# Patient Record
Sex: Female | Born: 1950
Health system: Southern US, Community
[De-identification: ages and names within clinical notes are randomized; demographics above are authoritative.]

## PROBLEM LIST (undated history)

## (undated) DIAGNOSIS — N183 Chronic kidney disease, stage 3 unspecified: Secondary | ICD-10-CM

## (undated) DIAGNOSIS — I10 Essential (primary) hypertension: Secondary | ICD-10-CM

## (undated) DIAGNOSIS — E119 Type 2 diabetes mellitus without complications: Secondary | ICD-10-CM

## (undated) DIAGNOSIS — G459 Transient cerebral ischemic attack, unspecified: Secondary | ICD-10-CM

## (undated) DIAGNOSIS — E7800 Pure hypercholesterolemia, unspecified: Secondary | ICD-10-CM

## (undated) HISTORY — DX: Pure hypercholesterolemia, unspecified: E78.00

## (undated) HISTORY — PX: UTERINE FIBROID SURGERY: SHX826

## (undated) HISTORY — PX: EYE SURGERY: SHX253

## (undated) HISTORY — PX: CARPAL TUNNEL WITH CUBITAL TUNNEL: SHX5608

## (undated) HISTORY — DX: Transient cerebral ischemic attack, unspecified: G45.9

## (undated) HISTORY — DX: Essential (primary) hypertension: I10

---

## 1998-08-11 ENCOUNTER — Encounter: Admission: RE | Admit: 1998-08-11 | Discharge: 1998-11-09 | Payer: Self-pay | Admitting: Family Medicine

## 1998-09-13 ENCOUNTER — Ambulatory Visit (HOSPITAL_BASED_OUTPATIENT_CLINIC_OR_DEPARTMENT_OTHER): Admission: RE | Admit: 1998-09-13 | Discharge: 1998-09-13 | Payer: Self-pay | Admitting: Orthopedic Surgery

## 2002-02-12 ENCOUNTER — Other Ambulatory Visit: Admission: RE | Admit: 2002-02-12 | Discharge: 2002-02-12 | Payer: Self-pay | Admitting: Family Medicine

## 2002-02-19 ENCOUNTER — Ambulatory Visit (HOSPITAL_COMMUNITY): Admission: RE | Admit: 2002-02-19 | Discharge: 2002-02-19 | Payer: Self-pay | Admitting: Family Medicine

## 2002-02-19 ENCOUNTER — Encounter: Payer: Self-pay | Admitting: Family Medicine

## 2002-03-09 ENCOUNTER — Encounter: Payer: Self-pay | Admitting: Family Medicine

## 2002-03-09 ENCOUNTER — Ambulatory Visit (HOSPITAL_COMMUNITY): Admission: RE | Admit: 2002-03-09 | Discharge: 2002-03-09 | Payer: Self-pay | Admitting: Family Medicine

## 2002-04-14 ENCOUNTER — Encounter (INDEPENDENT_AMBULATORY_CARE_PROVIDER_SITE_OTHER): Payer: Self-pay

## 2002-04-14 ENCOUNTER — Encounter: Payer: Self-pay | Admitting: Family Medicine

## 2002-04-14 ENCOUNTER — Encounter: Admission: RE | Admit: 2002-04-14 | Discharge: 2002-04-14 | Payer: Self-pay | Admitting: Family Medicine

## 2005-07-12 ENCOUNTER — Encounter: Admission: RE | Admit: 2005-07-12 | Discharge: 2005-08-05 | Payer: Self-pay | Admitting: Family Medicine

## 2007-07-06 ENCOUNTER — Emergency Department (HOSPITAL_COMMUNITY): Admission: EM | Admit: 2007-07-06 | Discharge: 2007-07-06 | Payer: Self-pay | Admitting: Emergency Medicine

## 2008-09-02 ENCOUNTER — Other Ambulatory Visit: Admission: RE | Admit: 2008-09-02 | Discharge: 2008-09-02 | Payer: Self-pay | Admitting: Family Medicine

## 2009-09-25 ENCOUNTER — Emergency Department (HOSPITAL_COMMUNITY): Admission: EM | Admit: 2009-09-25 | Discharge: 2009-09-25 | Payer: Self-pay | Admitting: Emergency Medicine

## 2010-10-25 LAB — GLUCOSE, CAPILLARY
Glucose-Capillary: 249 mg/dL — ABNORMAL HIGH (ref 70–99)
Glucose-Capillary: 329 mg/dL — ABNORMAL HIGH (ref 70–99)
Glucose-Capillary: 413 mg/dL — ABNORMAL HIGH (ref 70–99)

## 2011-01-02 ENCOUNTER — Telehealth: Payer: Self-pay

## 2011-01-04 NOTE — Telephone Encounter (Signed)
Spoke with pt. She said she is anemic. She will schedule OV first. Darius Bump to schedule.

## 2011-01-05 HISTORY — PX: COLONOSCOPY W/ POLYPECTOMY: SHX1380

## 2011-01-08 ENCOUNTER — Encounter: Payer: Self-pay | Admitting: Gastroenterology

## 2011-01-08 ENCOUNTER — Ambulatory Visit (INDEPENDENT_AMBULATORY_CARE_PROVIDER_SITE_OTHER): Payer: BC Managed Care – PPO | Admitting: Gastroenterology

## 2011-01-08 VITALS — BP 159/79 | HR 90 | Temp 97.6°F | Ht 63.0 in | Wt 218.0 lb

## 2011-01-08 DIAGNOSIS — Z1212 Encounter for screening for malignant neoplasm of rectum: Secondary | ICD-10-CM | POA: Insufficient documentation

## 2011-01-08 DIAGNOSIS — Z1211 Encounter for screening for malignant neoplasm of colon: Secondary | ICD-10-CM | POA: Insufficient documentation

## 2011-01-08 NOTE — Assessment & Plan Note (Addendum)
60 year old female with need for original screening colonoscopy; she has never had a colonoscopy. No change in bowel habits, no melena or brbpr. No lack of appetite or wt changes. Very rare achy abdominal pain, located at level of umbilicus, every few months. Not relieved or exacerbated by anything. Not associated with any other symptoms. Question functional abdominal pain, scar tissue. No need for further work-up of this currently; we will proceed with screening colonoscopy.  Proceed with TCS with Dr. Oneida Alar in near future: the risks, benefits, and alternatives have been discussed with the patient in detail. The patient states understanding and desires to proceed. 1/2 dose Metformin evening prior, none day of  OBTAIN most recent labs from Dr. Criss Rosales. ?anemia? Lantus 10 units evening prior (usually takes 60) No Januvia morning of

## 2011-01-08 NOTE — Progress Notes (Signed)
Referring Provider: Dr. Criss Rosales Primary Care Physician:  Elyn Peers, MD Primary Gastroenterologist:  Dr. Oneida Alar  Chief Complaint  Patient presents with  . Colon Cancer Screening    HPI:  Kelly Blair is a 60 y.o. female here as a referral from Dr. Criss Rosales for screening colonoscopy. She has never had one prior. Reports a BM usually every day; the past few days she has had a few loose stools, but she has also over-indulged her diet this weekend. Denies melena or brbpr. No N/V. No lack of appetite or wt loss. No FH of colorectal ca. She does report very rare episodes of vague, intermittent, "achy" abdominal pain located at level of umbilicus. Usually every few months, rated as 3/4 out of 10. States not bothersome, just notices because she rarely has any discomfort. Not exacerbated or relieved by anything. As of note, does have midline laparotomy scar below umbilicus from prior surgery; states can't wear pants that hit at this level. Rare episodes of discomfort may be due to a benign process such as scar tissue. Pt does not feel this is a concern.   Some question of anemia per pt report/scheduling notes. No labs currently.   Past Medical History  Diagnosis Date  . Diabetes mellitus   . Hypertension   . Hypercholesterolemia     Past Surgical History  Procedure Date  . Fibroid removal from uterus   . Left carpal tunnel repair   . Pinched nerve left arm     Current Outpatient Prescriptions  Medication Sig Dispense Refill  . amLODipine (NORVASC) 5 MG tablet Take 5 mg by mouth daily.        . Amlodipine-Valsartan-HCTZ 10-320-25 MG TABS Take by mouth.        . insulin glargine (LANTUS) 100 UNIT/ML injection Inject 60 Units into the skin at bedtime.        . metFORMIN (GLUCOPHAGE) 500 MG tablet Take 500 mg by mouth 2 (two) times daily with a meal.        . simvastatin (ZOCOR) 40 MG tablet Take 40 mg by mouth at bedtime.        . sitaGLIPtan (JANUVIA) 100 MG tablet Take 100 mg by mouth  daily.          Allergies as of 01/08/2011  . (No Known Allergies)    Family History  Problem Relation Age of Onset  . Uterine cancer Mother     deceased, natural causes, dementia  . Diabetes Mother   . Heart disease Father     deceased secondary to septicemia    History   Social History  . Marital Status: Single    Spouse Name: N/A    Number of Children: N/A  . Years of Education: N/A   Occupational History  . laid-off     [employer]   Social History Main Topics  . Smoking status: Never Smoker   . Smokeless tobacco: Not on file  . Alcohol Use: No  . Drug Use: No  . Sexually Active: Not on file     Review of Systems: Gen: Denies any fever, chills, sweats, anorexia, fatigue, weakness, malaise, weight loss, and sleep disorder CV: Denies chest pain, angina, palpitations, syncope, orthopnea, PND, peripheral edema, and claudication. Resp: Denies dyspnea at rest, dyspnea with exercise, cough, sputum, wheezing, coughing up blood, and pleurisy. GI: Denies vomiting blood, jaundice, and fecal incontinence.   Denies dysphagia or odynophagia. GU : Denies urinary burning, blood in urine, urinary frequency, urinary hesitancy, nocturnal  urination, and urinary incontinence. MS: Denies joint pain, limitation of movement, and swelling, stiffness, low back pain, extremity pain. Denies muscle weakness, cramps, atrophy.  Derm: Denies rash, itching, dry skin, hives, moles, warts, or unhealing ulcers.  Psych: Denies depression, anxiety, memory loss, suicidal ideation, hallucinations, paranoia, and confusion. Heme: Denies bruising, bleeding, and enlarged lymph nodes.  Physical Exam: BP 159/79  Pulse 90  Temp(Src) 97.6 F (36.4 C) (Temporal)  Ht 5\' 3"  (1.6 m)  Wt 218 lb (98.884 kg)  BMI 38.62 kg/m2 General:   Alert,  Well-developed, well-nourished, pleasant and cooperative in NAD Head:  Normocephalic and atraumatic. Eyes:  Sclera clear, no icterus.   Conjunctiva pink. Ears:   Normal auditory acuity. Nose:  No deformity, discharge,  or lesions. Mouth:  No deformity or lesions, dentition normal. Neck:  Supple; no masses or thyromegaly. Lungs:  Clear throughout to auscultation.   No wheezes, crackles, or rhonchi. No acute distress. Heart:  Regular rate and rhythm; no murmurs, clicks, rubs,  or gallops. Abdomen:  Soft, nontender and nondistended. Obese. No masses, hepatosplenomegaly or hernias noted. Normal bowel sounds, without guarding, and without rebound. Midline laparotomy scar noted. Rectal:  Deferred until time of colonoscopy.   Msk:  Symmetrical without gross deformities. Normal posture. Extremities:  Without clubbing or edema. Neurologic:  Alert and  oriented x4;  grossly normal neurologically. Skin:  Intact without significant lesions or rashes. Cervical Nodes:  No significant cervical adenopathy. Psych:  Alert and cooperative. Normal mood and affect.

## 2011-01-08 NOTE — Progress Notes (Signed)
Cc to PCP 

## 2011-01-08 NOTE — Patient Instructions (Signed)
We have set you up for a screening colonoscopy. Please see the instructions below for adjustment of your diabetes medication:  1. Take 1/2 dose of metformin the evening before the procedure. Do not take any the morning of the procedure 2. Take Lantus 10 units the evening before the procedure.  3. No Januvia the morning of the procedure.  Continue to monitor your blood sugar for any drops; monitor any signs/symptoms of hypoglycemia such as weak, shaky, clammy, nausea, etc.

## 2011-01-22 ENCOUNTER — Encounter: Payer: BC Managed Care – PPO | Admitting: Gastroenterology

## 2011-01-22 ENCOUNTER — Other Ambulatory Visit: Payer: Self-pay | Admitting: Gastroenterology

## 2011-01-22 ENCOUNTER — Ambulatory Visit (HOSPITAL_COMMUNITY)
Admission: RE | Admit: 2011-01-22 | Discharge: 2011-01-22 | Disposition: A | Discharge status: Home / Self Care | Payer: BC Managed Care – PPO | Source: Ambulatory Visit | Attending: Gastroenterology | Admitting: Gastroenterology

## 2011-01-22 DIAGNOSIS — D126 Benign neoplasm of colon, unspecified: Secondary | ICD-10-CM

## 2011-01-22 DIAGNOSIS — I10 Essential (primary) hypertension: Secondary | ICD-10-CM | POA: Insufficient documentation

## 2011-01-22 DIAGNOSIS — Z1211 Encounter for screening for malignant neoplasm of colon: Secondary | ICD-10-CM | POA: Insufficient documentation

## 2011-01-22 DIAGNOSIS — E785 Hyperlipidemia, unspecified: Secondary | ICD-10-CM | POA: Insufficient documentation

## 2011-01-22 DIAGNOSIS — Z79899 Other long term (current) drug therapy: Secondary | ICD-10-CM | POA: Insufficient documentation

## 2011-01-22 DIAGNOSIS — K648 Other hemorrhoids: Secondary | ICD-10-CM

## 2011-01-22 DIAGNOSIS — Z83719 Family history of colon polyps, unspecified: Secondary | ICD-10-CM | POA: Insufficient documentation

## 2011-01-22 DIAGNOSIS — Z8371 Family history of colonic polyps: Secondary | ICD-10-CM | POA: Insufficient documentation

## 2011-01-22 DIAGNOSIS — Z794 Long term (current) use of insulin: Secondary | ICD-10-CM | POA: Insufficient documentation

## 2011-01-22 DIAGNOSIS — E119 Type 2 diabetes mellitus without complications: Secondary | ICD-10-CM | POA: Insufficient documentation

## 2011-01-22 LAB — GLUCOSE, CAPILLARY: Glucose-Capillary: 116 mg/dL — ABNORMAL HIGH (ref 70–99)

## 2011-01-22 NOTE — Progress Notes (Signed)
TCS 6/18-TC polyp

## 2011-01-25 NOTE — Progress Notes (Signed)
Cc path to Dr. Criss Rosales

## 2011-01-31 NOTE — Op Note (Signed)
  Kelly Blair, Kelly Blair              ACCOUNT NO.:  [id number]  MEDICAL RECORD NO.:  [id number]  LOCATION:  [facility]                          FACILITY:  [facility]  PHYSICIAN:  Barney Drain, M.D.     DATE OF BIRTH:  03-25-1951  DATE OF PROCEDURE:  01/22/2011 DATE OF DISCHARGE:                              OPERATIVE REPORT   REFERRING PHYSICIAN:  Lucianne Lei, MD  PROCEDURE:  Colonoscopy with cold forceps polypectomy.  INDICATION FOR EXAM:  [name] is a 60 year old female who presents for average-risk colon cancer screening.  Her mother had polyps at age greater than 75.  FINDINGS: 1. Slightly tortuous colon.  A 4 mm mid transverse colon polyp removed     via cold forceps.  Otherwise no masses, inflammatory changes or     AVMs seen.  No diverticulosis. 2. Small internal hemorrhoids.  Otherwise normal retroflexed view of     the rectum.  RECOMMENDATIONS: 1. She should follow a high-fiber diet.  She was given a handout on     high-fiber diet, polyps and hemorrhoids. 2. Screening colonoscopy in 10 years if she has a simple adenoma.  MEDICATIONS: 1. Demerol 75 mg IV. 2. Versed 4 mg IV.  PROCEDURE TECHNIQUE:  Physical exam was performed.  Informed consent was obtained from the patient after explaining the benefits, risks and alternatives to the procedure.  The patient was connected to the monitor and placed in left lateral position.  Continuous oxygen was provided by nasal cannula and IV medicine administered through an indwelling cannula.  After administration of sedation and rectal exam, the patient's rectum was intubated and the scope was advanced under direct visualization to the cecum.  The scope was removed slowly by carefully examining the color, texture, anatomy and integrity of the mucosa on the way out.  The patient was recovered in endoscopy and discharged home in satisfactory condition.  PATH: SIMPLE ADENOMA   Barney Drain, M.D.   CC: DR. Criss Rosales  SF/MEDQ  D:   01/22/2011  T:  01/23/2011  Job:  [id number]  Electronically Signed by Barney Drain M.D. on 01/31/2011 [id number] PM

## 2011-05-14 LAB — CULTURE, ROUTINE-ABSCESS

## 2013-07-06 DIAGNOSIS — G459 Transient cerebral ischemic attack, unspecified: Secondary | ICD-10-CM

## 2013-07-06 HISTORY — DX: Transient cerebral ischemic attack, unspecified: G45.9

## 2013-07-20 ENCOUNTER — Encounter (HOSPITAL_COMMUNITY): Payer: Self-pay | Admitting: Emergency Medicine

## 2013-07-20 ENCOUNTER — Emergency Department (HOSPITAL_COMMUNITY): Payer: BC Managed Care – PPO

## 2013-07-20 ENCOUNTER — Inpatient Hospital Stay (HOSPITAL_COMMUNITY)
Admission: EM | Admit: 2013-07-20 | Discharge: 2013-07-22 | DRG: 069 | Disposition: A | Discharge status: Home / Self Care | Payer: BC Managed Care – PPO | Attending: Internal Medicine | Admitting: Internal Medicine

## 2013-07-20 DIAGNOSIS — E1142 Type 2 diabetes mellitus with diabetic polyneuropathy: Secondary | ICD-10-CM | POA: Diagnosis present

## 2013-07-20 DIAGNOSIS — E1139 Type 2 diabetes mellitus with other diabetic ophthalmic complication: Secondary | ICD-10-CM | POA: Diagnosis present

## 2013-07-20 DIAGNOSIS — Z9119 Patient's noncompliance with other medical treatment and regimen: Secondary | ICD-10-CM

## 2013-07-20 DIAGNOSIS — Z794 Long term (current) use of insulin: Secondary | ICD-10-CM

## 2013-07-20 DIAGNOSIS — I160 Hypertensive urgency: Secondary | ICD-10-CM

## 2013-07-20 DIAGNOSIS — E083299 Diabetes mellitus due to underlying condition with mild nonproliferative diabetic retinopathy without macular edema, unspecified eye: Secondary | ICD-10-CM

## 2013-07-20 DIAGNOSIS — E11329 Type 2 diabetes mellitus with mild nonproliferative diabetic retinopathy without macular edema: Secondary | ICD-10-CM | POA: Diagnosis present

## 2013-07-20 DIAGNOSIS — G459 Transient cerebral ischemic attack, unspecified: Principal | ICD-10-CM | POA: Diagnosis present

## 2013-07-20 DIAGNOSIS — I129 Hypertensive chronic kidney disease with stage 1 through stage 4 chronic kidney disease, or unspecified chronic kidney disease: Secondary | ICD-10-CM | POA: Diagnosis present

## 2013-07-20 DIAGNOSIS — Z1211 Encounter for screening for malignant neoplasm of colon: Secondary | ICD-10-CM

## 2013-07-20 DIAGNOSIS — Z79899 Other long term (current) drug therapy: Secondary | ICD-10-CM

## 2013-07-20 DIAGNOSIS — G43909 Migraine, unspecified, not intractable, without status migrainosus: Secondary | ICD-10-CM | POA: Diagnosis present

## 2013-07-20 DIAGNOSIS — E1149 Type 2 diabetes mellitus with other diabetic neurological complication: Secondary | ICD-10-CM | POA: Diagnosis present

## 2013-07-20 DIAGNOSIS — N183 Chronic kidney disease, stage 3 unspecified: Secondary | ICD-10-CM | POA: Diagnosis present

## 2013-07-20 DIAGNOSIS — Z7982 Long term (current) use of aspirin: Secondary | ICD-10-CM

## 2013-07-20 DIAGNOSIS — I10 Essential (primary) hypertension: Secondary | ICD-10-CM

## 2013-07-20 DIAGNOSIS — Z91199 Patient's noncompliance with other medical treatment and regimen due to unspecified reason: Secondary | ICD-10-CM

## 2013-07-20 DIAGNOSIS — E785 Hyperlipidemia, unspecified: Secondary | ICD-10-CM

## 2013-07-20 DIAGNOSIS — I639 Cerebral infarction, unspecified: Secondary | ICD-10-CM

## 2013-07-20 LAB — URINALYSIS, ROUTINE W REFLEX MICROSCOPIC
Bilirubin Urine: NEGATIVE
Glucose, UA: >1000 mg/dL — AB
Leukocytes, UA: NEGATIVE
Nitrite: NEGATIVE
Protein, ur: >300 mg/dL — AB
Urobilinogen, UA: 0.2 mg/dL (ref 0.0–1.0)

## 2013-07-20 LAB — CBC
HCT: 39.8 % (ref 36.0–46.0)
Hemoglobin: 13.3 g/dL (ref 12.0–15.0)
MCH: 27.8 pg (ref 26.0–34.0)
MCHC: 33.4 g/dL (ref 30.0–36.0)
MCV: 83.1 fL (ref 78.0–100.0)
Platelets: 257 K/uL (ref 150–400)
RBC: 4.79 MIL/uL (ref 3.87–5.11)
RDW: 14 % (ref 11.5–15.5)
WBC: 10.4 K/uL (ref 4.0–10.5)

## 2013-07-20 LAB — GLUCOSE, CAPILLARY
Glucose-Capillary: 513 mg/dL — ABNORMAL HIGH (ref 70–99)
Glucose-Capillary: 343 mg/dL — ABNORMAL HIGH (ref 70–99)
Glucose-Capillary: 297 mg/dL — ABNORMAL HIGH (ref 70–99)

## 2013-07-20 LAB — COMPREHENSIVE METABOLIC PANEL
Albumin: 3.5 g/dL (ref 3.5–5.2)
BUN: 12 mg/dL (ref 6–23)
CO2: 25 mEq/L (ref 19–32)
Chloride: 96 mEq/L (ref 96–112)
Creatinine, Ser: 1.31 mg/dL — ABNORMAL HIGH (ref 0.50–1.10)
GFR calc non Af Amer: 43 mL/min — ABNORMAL LOW (ref >90)
Total Bilirubin: 0.2 mg/dL — ABNORMAL LOW (ref 0.3–1.2)
Total Protein: 7.8 g/dL (ref 6.0–8.3)

## 2013-07-20 MED ORDER — SODIUM CHLORIDE 0.9 % IV SOLN
INTRAVENOUS | Status: DC
  Administered 2013-07-21: via INTRAVENOUS

## 2013-07-20 MED ORDER — ACETAMINOPHEN 650 MG RE SUPP: 650.0000 mg | RECTAL | Status: DC | PRN

## 2013-07-20 MED ORDER — ACETAMINOPHEN 325 MG PO TABS: 650.0000 mg | ORAL_TABLET | ORAL | Status: DC | PRN

## 2013-07-20 MED ORDER — SENNOSIDES-DOCUSATE SODIUM 8.6-50 MG PO TABS
1.0000 | ORAL_TABLET | Freq: Every evening | ORAL | Status: DC | PRN
  Filled 2013-07-20: qty 1

## 2013-07-20 MED ORDER — LORAZEPAM 2 MG/ML IJ SOLN
1.0000 mg | INTRAMUSCULAR | Status: DC | PRN
  Administered 2013-07-20: 1 mg via INTRAVENOUS
  Filled 2013-07-20: qty 1

## 2013-07-20 MED ORDER — INSULIN ASPART 100 UNIT/ML ~~LOC~~ SOLN
10.0000 [IU] | Freq: Once | SUBCUTANEOUS | Status: AC
  Administered 2013-07-20: 10 [IU] via SUBCUTANEOUS
  Filled 2013-07-20: qty 1

## 2013-07-20 MED ORDER — MORPHINE SULFATE 4 MG/ML IJ SOLN
4.0000 mg | INTRAMUSCULAR | Status: DC | PRN
  Administered 2013-07-20: 4 mg via INTRAVENOUS
  Filled 2013-07-20: qty 1

## 2013-07-20 MED ORDER — ONDANSETRON HCL 4 MG/2ML IJ SOLN
4.0000 mg | Freq: Once | INTRAMUSCULAR | Status: AC
  Administered 2013-07-20: 4 mg via INTRAVENOUS
  Filled 2013-07-20: qty 2

## 2013-07-20 MED ORDER — ASPIRIN 325 MG PO TABS
325.0000 mg | ORAL_TABLET | Freq: Every day | ORAL | Status: DC
  Administered 2013-07-21: 325 mg via ORAL
  Filled 2013-07-20: qty 1

## 2013-07-20 MED ORDER — HYDROCODONE-ACETAMINOPHEN 5-325 MG PO TABS
1.0000 | ORAL_TABLET | ORAL | Status: DC | PRN
  Administered 2013-07-22 (×2): 2 via ORAL
  Filled 2013-07-20 (×2): qty 2

## 2013-07-20 MED ORDER — SODIUM CHLORIDE 0.9 % IV BOLUS (SEPSIS)
1000.0000 mL | Freq: Once | INTRAVENOUS | Status: AC
  Administered 2013-07-20: 1000 mL via INTRAVENOUS

## 2013-07-20 MED ORDER — METOCLOPRAMIDE HCL 5 MG/ML IJ SOLN
10.0000 mg | Freq: Once | INTRAMUSCULAR | Status: AC
  Administered 2013-07-20: 10 mg via INTRAVENOUS
  Filled 2013-07-20: qty 2

## 2013-07-20 MED ORDER — ASPIRIN 300 MG RE SUPP
300.0000 mg | Freq: Every day | RECTAL | Status: DC
  Filled 2013-07-20: qty 1

## 2013-07-20 MED ORDER — INSULIN GLARGINE 100 UNIT/ML ~~LOC~~ SOLN
60.0000 [IU] | Freq: Every day | SUBCUTANEOUS | Status: DC
  Administered 2013-07-21: 60 [IU] via SUBCUTANEOUS
  Filled 2013-07-20 (×3): qty 0.6

## 2013-07-20 MED ORDER — INSULIN ASPART 100 UNIT/ML ~~LOC~~ SOLN
0.0000 [IU] | SUBCUTANEOUS | Status: DC
  Administered 2013-07-21: 5 [IU] via SUBCUTANEOUS
  Administered 2013-07-21: 3 [IU] via SUBCUTANEOUS
  Administered 2013-07-21: 5 [IU] via SUBCUTANEOUS
  Administered 2013-07-21: 2 [IU] via SUBCUTANEOUS
  Administered 2013-07-21: 3 [IU] via SUBCUTANEOUS
  Administered 2013-07-22: 1 [IU] via SUBCUTANEOUS
  Administered 2013-07-22: 3 [IU] via SUBCUTANEOUS

## 2013-07-20 MED ORDER — HYDRALAZINE HCL 20 MG/ML IJ SOLN
10.0000 mg | INTRAMUSCULAR | Status: DC | PRN
  Administered 2013-07-22: 10 mg via INTRAVENOUS
  Filled 2013-07-20: qty 1

## 2013-07-20 NOTE — ED Notes (Signed)
Rechecked CBG 458. Pt very lethargic but opens eyes when spoken to and will respond appropriately. Will notify MD.

## 2013-07-20 NOTE — ED Notes (Signed)
Obtained CBG 513. Pt alert and oriented and resting in bed. Pt has no neuro deficits.

## 2013-07-20 NOTE — ED Notes (Signed)
Pt vomited after stroke swallow

## 2013-07-20 NOTE — ED Provider Notes (Addendum)
CSN: [id number]     Arrival date & time 07/20/13  1556 History   First MD Initiated Contact with Patient 07/20/13 1557     Chief Complaint  Patient presents with  . Hyperglycemia    HPI Pt's PCP is Dr. Lucianne Lei, Family Medicine Patient presents via ambulance with high blood sugar and headache. She is insulin-dependent diabetic. She uses Lantus insulin 60 units at night, and PO Januvia.. She is out of her needle tips for her Lantus pen. Has not had her insulin for about 5 days. Developed a "tension headache" yesterday. She states she gets these over a few weeks because her job is very stressful period in length and onset yesterday but slowly progressive headache. Bifrontal. Did not have good appetite today has not eaten much. Again, no insulin for several days. She got dizzy in and bumped her head against a cabinet this morning. This was with a dizzy spell where she almost fell but did not. She did not have syncope. She denies any pain other than a throbbing headache. No speech difficulty. No weakness to the extremities. No chest pain or palpitations. Her mouth feels dry. Not polyuric per her report.  Past Medical History  Diagnosis Date  . Diabetes mellitus   . Hypertension   . Hypercholesterolemia    Past Surgical History  Procedure Laterality Date  . Fibroid removal from uterus    . Left carpal tunnel repair    . Pinched nerve left arm     Family History  Problem Relation Age of Onset  . Uterine cancer Mother     deceased, natural causes, dementia  . Diabetes Mother   . Heart disease Father     deceased secondary to septicemia   History  Substance Use Topics  . Smoking status: Never Smoker   . Smokeless tobacco: Not on file  . Alcohol Use: No   OB History   Grav Para Term Preterm Abortions TAB SAB Ect Mult Living                 Review of Systems  Constitutional: Negative for fever, chills, diaphoresis, appetite change and fatigue.  HENT: Negative for mouth sores,  sore throat and trouble swallowing.   Eyes: Negative for visual disturbance.  Respiratory: Negative for cough, chest tightness, shortness of breath and wheezing.   Cardiovascular: Negative for chest pain.  Gastrointestinal: Positive for nausea and vomiting. Negative for abdominal pain, diarrhea and abdominal distention.       Vomited once on arrival here. Denies other episodes of diarrhea or vomiting.  Endocrine: Positive for polydipsia. Negative for polyphagia and polyuria.  Genitourinary: Negative for dysuria, frequency and hematuria.  Musculoskeletal: Negative for gait problem.  Skin: Negative for color change, pallor and rash.  Neurological: Positive for dizziness and headaches. Negative for syncope and light-headedness.  Hematological: Does not bruise/bleed easily.  Psychiatric/Behavioral: Negative for behavioral problems and confusion.    Allergies  Review of patient's allergies indicates no known allergies.  Home Medications   Current Outpatient Rx  Name  Route  Sig  Dispense  Refill  . Canagliflozin 300 MG TABS   Oral   Take 300 mg by mouth every morning.         . sitaGLIPtan (JANUVIA) 100 MG tablet   Oral   Take 100 mg by mouth daily.           Marland Kitchen amLODipine (NORVASC) 10 MG tablet   Oral   Take 1 tablet (10 mg total)  by mouth daily.   30 tablet   0   . aspirin 81 MG chewable tablet   Oral   Chew 1 tablet (81 mg total) by mouth daily.         Marland Kitchen atorvastatin (LIPITOR) 10 MG tablet   Oral   Take 1 tablet (10 mg total) by mouth daily at 6 PM.   30 tablet   0   . HYDROcodone-acetaminophen (NORCO/VICODIN) 5-325 MG per tablet   Oral   Take 1-2 tablets by mouth every 4 (four) hours as needed for moderate pain.   15 tablet   0   . insulin glargine (LANTUS) 100 UNIT/ML injection   Subcutaneous   Inject 60 Units into the skin at bedtime.           Marland Kitchen losartan (COZAAR) 100 MG tablet   Oral   Take 1 tablet (100 mg total) by mouth daily.   30 tablet   0     BP 170/66  Pulse 71  Temp(Src) 97.9 F (36.6 C) (Oral)  Resp 18  Ht 5\' 4"  (1.626 m)  Wt 195 lb 1.7 oz (88.5 kg)  BMI 33.47 kg/m2  SpO2 98% Physical Exam  Constitutional: She is oriented to person, place, and time. No distress.  Awake alert. She keeps her eyes closed.  HENT:  Head: Normocephalic. Head is without raccoon's eyes, without Battle's sign, without abrasion, without contusion and without laceration.  Dry mucous membranes  Eyes: Conjunctivae are normal. Pupils are equal, round, and reactive to light. No scleral icterus.  Neck: Normal range of motion. Neck supple. No thyromegaly present.  Cardiovascular: Normal rate and regular rhythm.  Exam reveals no gallop and no friction rub.   No murmur heard. Pulmonary/Chest: Effort normal and breath sounds normal. No respiratory distress. She has no wheezes. She has no rales.  Abdominal: Soft. Bowel sounds are normal. She exhibits no distension. There is no tenderness. There is no rebound.  Musculoskeletal: Normal range of motion.  Neurological: She is alert and oriented to person, place, and time.  Normal cranial nerves. . The patient prefers to keep her eyes closed. She is symmetrical for upper and lower face. She has left upper and lower extremity weakness that is rather subtle, 4/5.    Skin: Skin is warm and dry. No rash noted.  Psychiatric: She has a normal mood and affect. Her behavior is normal.    ED Course  Procedures (including critical care time) Labs Review Labs Reviewed  COMPREHENSIVE METABOLIC PANEL - Abnormal; Notable for the following:    Sodium 134 (*)    Glucose, Bld 513 (*)    Creatinine, Ser 1.31 (*)    Alkaline Phosphatase 173 (*)    Total Bilirubin 0.2 (*)    GFR calc non Af Amer 43 (*)    GFR calc Af Amer 50 (*)    All other components within normal limits  URINALYSIS, ROUTINE W REFLEX MICROSCOPIC - Abnormal; Notable for the following:    Glucose, UA >1000 (*)    Hgb urine dipstick SMALL (*)     Protein, ur >300 (*)    All other components within normal limits  GLUCOSE, CAPILLARY - Abnormal; Notable for the following:    Glucose-Capillary 513 (*)    All other components within normal limits  GLUCOSE, CAPILLARY - Abnormal; Notable for the following:    Glucose-Capillary 458 (*)    All other components within normal limits  GLUCOSE, CAPILLARY - Abnormal; Notable for the following:  Glucose-Capillary 343 (*)    All other components within normal limits  URINE MICROSCOPIC-ADD ON - Abnormal; Notable for the following:    Bacteria, UA FEW (*)    All other components within normal limits  GLUCOSE, CAPILLARY - Abnormal; Notable for the following:    Glucose-Capillary 297 (*)    All other components within normal limits  HEMOGLOBIN A1C - Abnormal; Notable for the following:    Hemoglobin A1C 11.5 (*)    Mean Plasma Glucose 283 (*)    All other components within normal limits  LIPID PANEL - Abnormal; Notable for the following:    Cholesterol 264 (*)    LDL Cholesterol 176 (*)    All other components within normal limits  GLUCOSE, CAPILLARY - Abnormal; Notable for the following:    Glucose-Capillary 207 (*)    All other components within normal limits  GLUCOSE, CAPILLARY - Abnormal; Notable for the following:    Glucose-Capillary 167 (*)    All other components within normal limits  GLUCOSE, CAPILLARY - Abnormal; Notable for the following:    Glucose-Capillary 221 (*)    All other components within normal limits  BASIC METABOLIC PANEL - Abnormal; Notable for the following:    Glucose, Bld 262 (*)    Creatinine, Ser 1.30 (*)    GFR calc non Af Amer 43 (*)    GFR calc Af Amer 50 (*)    All other components within normal limits  GLUCOSE, CAPILLARY - Abnormal; Notable for the following:    Glucose-Capillary 239 (*)    All other components within normal limits  GLUCOSE, CAPILLARY - Abnormal; Notable for the following:    Glucose-Capillary 280 (*)    All other components within  normal limits  GLUCOSE, CAPILLARY - Abnormal; Notable for the following:    Glucose-Capillary 150 (*)    All other components within normal limits  GLUCOSE, CAPILLARY - Abnormal; Notable for the following:    Glucose-Capillary 179 (*)    All other components within normal limits  GLUCOSE, CAPILLARY - Abnormal; Notable for the following:    Glucose-Capillary 164 (*)    All other components within normal limits  MRSA PCR SCREENING  CBC   Imaging Review Mr Virgel Paling Wo Contrast  07/21/2013   CLINICAL DATA:  Headache.  Abnormal CT.  Rule out stroke.  EXAM: MRI HEAD WITHOUT CONTRAST  MRA HEAD WITHOUT CONTRAST  TECHNIQUE: Multiplanar, multiecho pulse sequences of the brain and surrounding structures were obtained without intravenous contrast. Angiographic images of the head were obtained using MRA technique without contrast.  COMPARISON:  CT head 07/20/2013  FINDINGS: MRI HEAD FINDINGS  Negative for acute infarct. No lesion in the right temporal lobe as suggested by CT. The CT finding apparently was due to artifact as there was motion on the CT scan. Negative for acute or chronic infarct in the right lateral temporal lobe in the area of concern.  Generalized atrophy. Moderate chronic microvascular ischemic change in the white matter. Brainstem and cerebellum are normal.  Negative for acute infarct.  Negative for hemorrhage or mass.  Normal enhancement following contrast infusion.  MRA HEAD FINDINGS  Both vertebral arteries are patent to the basilar. Posterior inferior cerebellar artery patent bilaterally. The basilar is widely patent. Superior cerebellar and posterior cerebral arteries are patent bilaterally without significant stenosis. Patent posterior communicating artery bilaterally.  Internal carotid artery is widely patent. Mild atherosclerotic irregularity in the cavernous carotid bilaterally without significant stenosis. Anterior and middle cerebral arteries are  patent bilaterally without stenosis.   Negative for cerebral aneurysm.  IMPRESSION: Atrophy and chronic microvascular ischemic change. No acute infarct or mass lesion. No right temporal lobe abnormality as questioned on CT which apparently was motion related artifact.  Negative MRA head.   Electronically Signed   By: Franchot Gallo M.D.   On: 07/21/2013 12:10   Mr Jeri Cos [id number] Contrast  07/21/2013   CLINICAL DATA:  Headache.  Abnormal CT.  Rule out stroke.  EXAM: MRI HEAD WITHOUT CONTRAST  MRA HEAD WITHOUT CONTRAST  TECHNIQUE: Multiplanar, multiecho pulse sequences of the brain and surrounding structures were obtained without intravenous contrast. Angiographic images of the head were obtained using MRA technique without contrast.  COMPARISON:  CT head 07/20/2013  FINDINGS: MRI HEAD FINDINGS  Negative for acute infarct. No lesion in the right temporal lobe as suggested by CT. The CT finding apparently was due to artifact as there was motion on the CT scan. Negative for acute or chronic infarct in the right lateral temporal lobe in the area of concern.  Generalized atrophy. Moderate chronic microvascular ischemic change in the white matter. Brainstem and cerebellum are normal.  Negative for acute infarct.  Negative for hemorrhage or mass.  Normal enhancement following contrast infusion.  MRA HEAD FINDINGS  Both vertebral arteries are patent to the basilar. Posterior inferior cerebellar artery patent bilaterally. The basilar is widely patent. Superior cerebellar and posterior cerebral arteries are patent bilaterally without significant stenosis. Patent posterior communicating artery bilaterally.  Internal carotid artery is widely patent. Mild atherosclerotic irregularity in the cavernous carotid bilaterally without significant stenosis. Anterior and middle cerebral arteries are patent bilaterally without stenosis.  Negative for cerebral aneurysm.  IMPRESSION: Atrophy and chronic microvascular ischemic change. No acute infarct or mass lesion. No right  temporal lobe abnormality as questioned on CT which apparently was motion related artifact.  Negative MRA head.   Electronically Signed   By: Franchot Gallo M.D.   On: 07/21/2013 12:10    EKG Interpretation    Date/Time:  Monday July 20 2013 16:10:31 EST Ventricular Rate:  94 PR Interval:  152 QRS Duration: 75 QT Interval:  380 QTC Calculation: 475 R Axis:   31 Text Interpretation:  Sinus rhythm Borderline T wave abnormalities Borderline prolonged QT interval ED PHYSICIAN INTERPRETATION AVAILABLE IN [facility] Confirmed by TEST, RECORD ([id number]) on 07/22/2013 9:18:00 AM            MDM   1. CVA (cerebral infarction)   2. 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 Hypertensive urgency   4. TIA (transient ischemic attack)   5. Hypertension   6. Non-adherence to medical treatment   7. Other and unspecified hyperlipidemia   8. Morbid obesity   9. Screening for colorectal cancer     Plan will be hydration. Glycemic control. CAT scan of her head concerning headache and the fall.  20:00:  CT shows right parietal infarct. Her exam shows subtle left upper lip chin and weakness. She is given one dose of pain medication for her headache. This point I think her headache is from her impact from her fall her fall her weakness her from her right hemispheric CVA with left-sided weakness. Discussed case with Dr. Leonel Ramsay. He is here evaluating the patient. This appears to be a subacute infarct. She is out of the window for lytic therapy. I discussed the case with hospitalist patient be admitted under their care.  Lebron Quam, MD 07/20/13 1635  Lebron Quam, MD 07/23/13 [phone]

## 2013-07-20 NOTE — H&P (Signed)
PCP: Elyn Peers, MD    Chief Complaint:  Headache and she could not see  HPI: Kelly Blair is a 62 y.o. female   has a past medical history of Diabetes mellitus; Hypertension; and Hypercholesterolemia.   Presented with  Patient have had intermittent headaches for the past 3 weeks. Today she fell in the bathroom. Once she came to her wok she was confused and had trouble seeing she also had a headache. She has not been taking her insulin and she does not know what she supposed to take for her hypertension. Since patient arrived to ER she has been somewhat somnolent, prior to my evaluation she was also given Ativan 1 mg IV. At this point she is unable to cooperate with exam or give accurate history she does state that she could not see due to sun being in her eyes. Family at bedside. Neurology have seen patient. CT scan was done that showed Right mid  temporal lobe stroke.  Hospitalist called for admission. Of note patient have had severely elevated BP up to 215.    Review of Systems:   Pertinent positives include: headaches worsening confusion.  Constitutional:  No weight loss, night sweats, Fevers, chills, fatigue, weight loss  HEENT:  No headaches, Difficulty swallowing,Tooth/dental problems,Sore throat,  No sneezing, itching, ear ache, nasal congestion, post nasal drip,  Cardio-vascular:  No chest pain, Orthopnea, PND, anasarca, dizziness, palpitations.no Bilateral lower extremity swelling  GI:  No heartburn, indigestion, abdominal pain, nausea, vomiting, diarrhea, change in bowel habits, loss of appetite, melena, blood in stool, hematemesis Resp:  no shortness of breath at rest. No dyspnea on exertion, No excess mucus, no productive cough, No non-productive cough, No coughing up of blood.No change in color of mucus.No wheezing. Skin:  no rash or lesions. No jaundice GU:  no dysuria, change in color of urine, no urgency or frequency. No straining to urinate.  No flank pain.   Musculoskeletal:  No joint pain or no joint swelling. No decreased range of motion. No back pain.  Psych:  No change in mood or affect. No depression or anxiety. No memory loss.  Neuro: no localizing neurological complaints, no tingling, no weakness, no double vision, no gait abnormality, no slurred speech, no confusion  Otherwise ROS are negative except for above, 10 systems were reviewed  Past Medical History: Past Medical History  Diagnosis Date  . Diabetes mellitus   . Hypertension   . Hypercholesterolemia    Past Surgical History  Procedure Laterality Date  . Fibroid removal from uterus    . Left carpal tunnel repair    . Pinched nerve left arm       Medications: Prior to Admission medications   Medication Sig Start Date End Date Taking? Authorizing Provider  Canagliflozin 300 MG TABS Take 300 mg by mouth every morning.   Yes Historical Provider, MD  naproxen sodium (ANAPROX) 220 MG tablet Take 220 mg by mouth 2 (two) times daily as needed (pain).   Yes Historical Provider, MD  sitaGLIPtan (JANUVIA) 100 MG tablet Take 100 mg by mouth daily.     Yes Historical Provider, MD  insulin glargine (LANTUS) 100 UNIT/ML injection Inject 60 Units into the skin at bedtime.      Historical Provider, MD    Allergies:  No Known Allergies  Social History:  Ambulatory independently   Lives at   home   reports that she has never smoked. She does not have any smokeless tobacco history on file. She  reports that she does not drink alcohol or use illicit drugs.   Family History: family history includes Diabetes in her mother; Heart disease in her father; Uterine cancer in her mother.    Physical Exam: Patient Vitals for the past 24 hrs:  BP Temp Temp src Pulse Resp SpO2  07/20/13 1846 169/75 mmHg - - 86 16 99 %  07/20/13 1700 208/92 mmHg - - 93 16 99 %  07/20/13 1612 - 99.3 F (37.4 C) Oral - - -  07/20/13 1606 218/97 mmHg - Oral 93 17 100 %    1. General:  in No Acute  distress 2. Psychological: somnolent and confused  3. Head/ENT:   Moist  Mucous Membranes                          Head Non traumatic, neck supple                          Normal  Dentition 4. SKIN: normal Skin turgor,  Skin clean Dry and intact no rash 5. Heart: Regular rate and rhythm no Murmur, Rub or gallop 6. Lungs: Clear to auscultation bilaterally, no wheezes or crackles   7. Abdomen: Soft, non-tender, Non distended 8. Lower extremities: no clubbing, cyanosis, or edema 9. Neurologically she is moving ext spontaneously appears to have equal grips but unable to cooperate further. 10. MSK: Normal range of motion  body mass index is unknown because there is no weight on file.   Labs on Admission:   Recent Labs  07/20/13 1610  NA 134*  K 3.7  CL 96  CO2 25  GLUCOSE 513*  BUN 12  CREATININE 1.31*  CALCIUM 9.4    Recent Labs  07/20/13 1610  AST 15  ALT 12  ALKPHOS 173*  BILITOT 0.2*  PROT 7.8  ALBUMIN 3.5   No results found for this basename: LIPASE, AMYLASE,  in the last 72 hours  Recent Labs  07/20/13 1610  WBC 10.4  HGB 13.3  HCT 39.8  MCV 83.1  PLT 257   No results found for this basename: CKTOTAL, CKMB, CKMBINDEX, TROPONINI,  in the last 72 hours No results found for this basename: TSH, T4TOTAL, FREET3, T3FREE, THYROIDAB,  in the last 72 hours No results found for this basename: VITAMINB12, FOLATE, FERRITIN, TIBC, IRON, RETICCTPCT,  in the last 72 hours No results found for this basename: HGBA1C    The CrCl is unknown because both a height and weight (above a minimum accepted value) are required for this calculation. ABG No results found for this basename: phart, pco2, po2, hco3, tco2, acidbasedef, o2sat     No results found for this basename: DDIMER     Other results:  I have pearsonaly reviewed this: ECG REPORT  Rate: 94  Rhythm: NSR ST&T Change: no ischemic changes  UA no evidence of UTI   Cultures:    Component Value Date/Time    SDES ABSCESS LEFT BUTTOCKS 07/07/2007 0555   SPECREQUEST NONE 07/07/2007 0555   CULT  Value: ABUNDANT GROUP B STREP(S.AGALACTIAE)ISOLATED Note: TESTING AGAINST S. AGALACTIAE NOT ROUTINELY PERFORMED DUE TO PREDICTABILITY OF AMP/PEN/VAN SUSCEPTIBILITY. 07/07/2007 0555   REPTSTATUS 07/09/2007 FINAL 07/07/2007 0555       Radiological Exams on Admission: Ct Head Wo Contrast  07/20/2013   CLINICAL DATA:  Hypertension with headache  EXAM: CT HEAD WITHOUT CONTRAST  TECHNIQUE: Contiguous axial images were obtained from the base  of the skull through the vertex without intravenous contrast. Study was obtained within 24 hr of patient's arrival at the emergency department.  COMPARISON:  None.  FINDINGS: There is mild diffuse atrophy. There is no mass, hemorrhage, extra-axial fluid collection, or midline shift.  There is decreased attenuation in the mid right temporal lobe laterally compared to the left, a finding concerning for recent infarct. No other findings felt to be concerning for acute infarct. There is moderate small vessel disease in the centra semiovale bilaterally.  Bony calvarium appears intact.  The mastoid air cells are clear.  IMPRESSION: Area concerning for recent infarct in the mid right temporal lobe periphery. There is atrophy with periventricular small vessel disease. No hemorrhage or midline shift. Study otherwise unremarkable.   Electronically Signed   By: Lowella Grip M.D.   On: 07/20/2013 17:41    Chart has been reviewed  Assessment/Plan  62 yo F with hx of HTN and poorly controlled DM here with confusion was found to have right temporal lobe CVA  Present on Admission:  . CVA (cerebral infarction) -  - will admit based on  CVA protocol, await results of MRA/MRI, Carotid Doppler and Echo, obtain cardiac enzymes,  ECG,  Lipid panel, TSH. Order PT/OT evaluation. Will make sure patient is on antiplatelet agent.  Neurology consult.     . Hypertension/ hypertensive urgency avoid over  aggressive correction but given ongoing headache will give PRN hydralazine and observe in stepdown.  . Diabetes mellitus due to underlying condition with mild nonproliferative diabetic retinopathy without macular edema Restart insulin but hold po meds for now. SSI Confusion - patient have been recently medicated no evidence of intracranial hemorrhage on CT. Will be going for MRI now. Neurology is aware of the patient and is following  Prophylaxis: SCD      CODE STATUS: FULL CODE  Other plan as per orders.  I have spent a total of 55 min on this admission  Kalaya Infantino 07/20/2013, 8:45 PM

## 2013-07-20 NOTE — ED Notes (Signed)
CT was going to transport patient. Pt started vomiting. EDP reporting will order nausea medicine.

## 2013-07-20 NOTE — ED Notes (Signed)
Pt increasingly lethargic. Pt restless and defecated all in the bed. Pt is unable to answer questions appropriately, but will open eyes and grunt in response to questions. MD notified of this. Pt's family is at bedside.

## 2013-07-20 NOTE — Consult Note (Signed)
Neurology Consultation Reason for Consult: Concern for stroke Referring Physician: Alyson Locket  CC: Headache  History is obtained from: Brother, patient  HPI: Kelly Blair is a 62 y.o. female with a history of diabetes, hypertension who has not been acting right for at least the past few days. She was able to go to work today, however began having a severe headache with photophobia. On her way home, she became lost and when directed to local station, reported that she was unable to see it. It was clearly within her field of view according to the brother. She was therefore brought to the emergency room for further evaluation.  She is complaining of headache on arrival, and was given morphine shortly prior to my exam and history.   LKW: Several days ago, unclear tpa given?: no, outside of window    ROS: A 14 point ROS was performed and is negative except as noted in the HPI.  Past Medical History  Diagnosis Date  . Diabetes mellitus   . Hypertension   . Hypercholesterolemia     Family History: Mother-diabetes  Social History: Tob: Never smoker  Exam: Current vital signs: BP 169/75  Pulse 86  Temp(Src) 98.7 F (37.1 C) (Oral)  Resp 16  SpO2 99% Vital signs in last 24 hours: Temp:  [98.7 F (37.1 C)-99.3 F (37.4 C)] 98.7 F (37.1 C) (12/15 2118) Pulse Rate:  [86-93] 86 (12/15 1846) Resp:  [16-17] 16 (12/15 1846) BP: (169-218)/(75-97) 169/75 mmHg (12/15 1846) SpO2:  [99 %-100 %] 99 % (12/15 1846)  General: In bed, NAD CV: Regular rate and rhythm Mental Status: Patient is lethargic, oriented to person, place, month, year, and situation. She is able to answer specific questions, but does not participate much in the history No signs of aphasia. She does incorrectly identify touches to the left as being to the right. Cranial Nerves: II: Visual Fields are notable for left hemianopia. Pupils are equal, round, and reactive to light.  Discs are difficult to  visualize. III,IV, VI: EOMI without ptosis or diploplia.  V: Facial sensation is symmetric to temperature VII: Facial movement is symmetric.  VIII: hearing is intact to voice X: Uvula elevates symmetrically XI: Shoulder shrug is symmetric. XII: tongue is midline without atrophy or fasciculations.  Motor: Tone is normal. Bulk is normal. 5/5 strength was present in all four extremities.  Sensory: Sensation is symmetric to light touch and temperature in the arms and legs. Deep Tendon Reflexes: 2+ and symmetric in the biceps and patellae.  Cerebellar: FNF  intact bilaterally Gait: Not tested due to patient somnolent        I have reviewed labs in epic and the results pertinent to this consultation are: Mildly elevated creatinine  I have reviewed the images obtained: CT head-hypodensity in the right temporal lobe concerning for an inferior division MCA infarct.  Impression: 62 year old female with likely temporal lobe stroke in the setting of diabetes and hypertension. She'll need to be admitted for further evaluation  Recommendations: 1. HgbA1c, fasting lipid panel 2. MRI, MRA  of the brain without contrast 3. Frequent neuro checks 4. Echocardiogram 5. Carotid dopplers 6. Prophylactic therapy-Antiplatelet med: Aspirin - dose 325mg  7. Risk factor modification 8. Telemetry monitoring 9. PT consult, OT consult, Speech consult    Roland Rack, MD [facility] [phone]  If 7pm- 7am, please page neurology on call at [phone].

## 2013-07-20 NOTE — ED Notes (Signed)
Per EMS, pt is diabetic and ran out of insulin needles so has not taken insulin in several days. She also has hypertension and has not had her meds in a while. EMS obtained BP 234/125, HR 96, CBG >500. Pt complains of generalized weakness, headache but has no neuro deficits.

## 2013-07-20 NOTE — ED Notes (Signed)
Pt fell in tub today and says she hit the back of her head. She also states was driving and got confused and didn't know where she is. Pt is alert and oriented now.

## 2013-07-21 ENCOUNTER — Inpatient Hospital Stay (HOSPITAL_COMMUNITY): Payer: BC Managed Care – PPO

## 2013-07-21 DIAGNOSIS — I10 Essential (primary) hypertension: Secondary | ICD-10-CM

## 2013-07-21 DIAGNOSIS — E1339 Other specified diabetes mellitus with other diabetic ophthalmic complication: Secondary | ICD-10-CM

## 2013-07-21 DIAGNOSIS — E785 Hyperlipidemia, unspecified: Secondary | ICD-10-CM | POA: Diagnosis present

## 2013-07-21 DIAGNOSIS — Z9119 Patient's noncompliance with other medical treatment and regimen: Secondary | ICD-10-CM

## 2013-07-21 DIAGNOSIS — I6350 Cerebral infarction due to unspecified occlusion or stenosis of unspecified cerebral artery: Secondary | ICD-10-CM

## 2013-07-21 DIAGNOSIS — G459 Transient cerebral ischemic attack, unspecified: Principal | ICD-10-CM

## 2013-07-21 DIAGNOSIS — E11329 Type 2 diabetes mellitus with mild nonproliferative diabetic retinopathy without macular edema: Secondary | ICD-10-CM

## 2013-07-21 LAB — LIPID PANEL
Cholesterol: 264 mg/dL — ABNORMAL HIGH (ref 0–200)
HDL: 68 mg/dL (ref >39)
LDL Cholesterol: 176 mg/dL — ABNORMAL HIGH (ref 0–99)
Total CHOL/HDL Ratio: 3.9 RATIO
Triglycerides: 100 mg/dL (ref <150)
VLDL: 20 mg/dL (ref 0–40)

## 2013-07-21 LAB — BASIC METABOLIC PANEL
BUN: 11 mg/dL (ref 6–23)
Calcium: 8.6 mg/dL (ref 8.4–10.5)
Chloride: 102 mEq/L (ref 96–112)
Creatinine, Ser: 1.3 mg/dL — ABNORMAL HIGH (ref 0.50–1.10)
GFR calc Af Amer: 50 mL/min — ABNORMAL LOW (ref >90)
GFR calc non Af Amer: 43 mL/min — ABNORMAL LOW (ref >90)
Potassium: 4.1 mEq/L (ref 3.5–5.1)

## 2013-07-21 LAB — GLUCOSE, CAPILLARY
Glucose-Capillary: 221 mg/dL — ABNORMAL HIGH (ref 70–99)
Glucose-Capillary: 239 mg/dL — ABNORMAL HIGH (ref 70–99)

## 2013-07-21 LAB — MRSA PCR SCREENING: MRSA by PCR: NEGATIVE

## 2013-07-21 LAB — HEMOGLOBIN A1C: Hgb A1c MFr Bld: 11.5 % — ABNORMAL HIGH (ref <5.7)

## 2013-07-21 MED ORDER — AMLODIPINE BESYLATE 5 MG PO TABS
5.0000 mg | ORAL_TABLET | Freq: Every day | ORAL | Status: DC
  Administered 2013-07-21 – 2013-07-22 (×2): 5 mg via ORAL
  Filled 2013-07-21 (×3): qty 1

## 2013-07-21 MED ORDER — GADOBENATE DIMEGLUMINE 529 MG/ML IV SOLN
20.0000 mL | Freq: Once | INTRAVENOUS | Status: AC
  Administered 2013-07-21: 20 mL via INTRAVENOUS

## 2013-07-21 MED ORDER — ASPIRIN 81 MG PO CHEW
81.0000 mg | CHEWABLE_TABLET | Freq: Every day | ORAL | Status: DC
  Administered 2013-07-22: 81 mg via ORAL
  Filled 2013-07-21: qty 1

## 2013-07-21 MED ORDER — ATORVASTATIN CALCIUM 10 MG PO TABS
10.0000 mg | ORAL_TABLET | Freq: Every day | ORAL | Status: DC
  Filled 2013-07-21 (×2): qty 1

## 2013-07-21 MED ORDER — LOSARTAN POTASSIUM 50 MG PO TABS
50.0000 mg | ORAL_TABLET | Freq: Every day | ORAL | Status: DC
  Administered 2013-07-21 – 2013-07-22 (×2): 50 mg via ORAL
  Filled 2013-07-21 (×2): qty 1

## 2013-07-21 NOTE — Progress Notes (Addendum)
Occupational Therapy Evaluation Patient Details Name: Kelly Blair MRN: [id number] DOB: 1951/02/17 Today's Date: 07/21/2013 Time: [id number] OT Time Calculation (min): 33 min  OT Assessment / Plan / Recommendation History of present illness          Clinical Impression  62 y.o. female with a history of diabetes, hypertension who has not been acting right for at least the past few days. She was able to go to work today, however began having a severe headache with photophobia. On her way home, she became lost and when directed to local station, reported that she was unable to see it. It was clearly within her field of view according to the brother. She was therefore brought to the emergency room for further evaluation.      PTA, pt lived with cousin( who is disabled), and was independent with ADL and mobility. Pt worked in Therapist, art at [employer] and discussed how she does not like her job. Pt with apparent inconsistencies on eval. Presents with field cut in superior vision and L inferior quadrant at times. Reports she sees "dove like shapes" coming into her visual field from her R. States she walks "like a child with CP" at times. And that these symptoms have been going on for @ 2 weeks. During eval, pt with unsteady gait and extreme forward posture, requiring Max A to avoid falls. Apparent slow processing and difficulty with problem solving.  Abnormal postural control and reports of abnormal sensation on L side. At this time, pt is not safe to D/C home and will benefit from CIR to increase level of independence with ADL and moblity to facilitate safe D/C home. May benefit from psych consult.    OT Assessment  Patient needs continued OT Services    Follow Up Recommendations  CIR    Barriers to Discharge      Equipment Recommendations  3 in 1 bedside comode;Tub/shower bench    Recommendations for Other Services Rehab consult Psych consult  Frequency  Min 3X/week     Precautions / Restrictions Precautions Precautions: Fall Precaution Comments: per Dr. Leonie Man pt with L superior field cut Restrictions Weight Bearing Restrictions: No   Pertinent Vitals/Pain After mobilization BP 194/79. Sitting 169/78. HR 85. RR 18    ADL  Eating/Feeding: Independent Where Assessed - Eating/Feeding: Chair Grooming: Set up Where Assessed - Grooming: Supported sitting Upper Body Bathing: Set up Where Assessed - Upper Body Bathing: Unsupported sitting Lower Body Bathing: Moderate assistance Where Assessed - Lower Body Bathing: Supported sit to stand Upper Body Dressing: Minimal assistance Where Assessed - Upper Body Dressing: Supported sitting Lower Body Dressing: Moderate assistance Where Assessed - Lower Body Dressing: Supported sit to Lobbyist: Moderate assistance Toilet Transfer Method: Sit to stand Toilet Transfer Equipment: Other (comment) (bed - chair) Equipment Used: Gait belt Transfers/Ambulation Related to ADLs: min A transfer sit - stand.; Max at times with mobility. Staggering gait with forward osture. Poor awreness of self during ambulation; high risk for falls ADL Comments: decreased safety with ADL    OT Diagnosis: Generalized weakness;Cognitive deficits;Disturbance of vision;Altered mental status  OT Problem List: Decreased activity tolerance;Impaired balance (sitting and/or standing);Impaired vision/perception;Decreased cognition;Decreased safety awareness;Decreased knowledge of use of DME or AE;Cardiopulmonary status limiting activity;Impaired sensation OT Treatment Interventions: Self-care/ADL training;Therapeutic exercise;Neuromuscular education;Energy conservation;DME and/or AE instruction;Therapeutic activities;Cognitive remediation/compensation;Visual/perceptual remediation/compensation;Patient/family education;Balance training   OT Goals(Current goals can be found in the care plan section) Acute Rehab OT Goals Patient Stated Goal:  home OT Goal Formulation: With  patient Time For Goal Achievement: 08/04/13 Potential to Achieve Goals: Good  Visit Information  Last OT Received On: 07/21/13 Assistance Needed: +1 History of Present Illness: Pt admitted with headache and confusion. Suspect R temporal lobe lession.       Prior [place] expects to be discharged to:: Private residence Living Arrangements: Other relatives (cousin who isn't healthy) Available Help at Discharge: Family;Available PRN/intermittently Type of Home: House Home Access: Ramped entrance Home Layout: One level Home Equipment: None  Lives With: Family (cousin Allie Dimmer is disabled) Prior Function Level of Independence: Independent Comments: works at [employer]: No difficulties Dominant Hand: Right         Vision/Perception Vision - History Baseline Vision: Wears glasses all the time Patient Visual Report: Blurring of vision Vision - Assessment Eye Alignment: Within Functional Limits Vision Assessment: Vision tested Ocular Range of Motion: Within Functional Limits Alignment/Gaze Preference: Within Defined Limits Tracking/Visual Pursuits: Able to track stimulus in all quads without difficulty Saccades: Within functional limits Visual Fields: Impaired - to be further tested in functional context (inconsistent with field deficit. Pt reports not being ) Perception Perception: Within Functional Limits Praxis Praxis: Impaired Praxis Impairment Details: Motor planning (demonstrates defficulty with organizing tasks)   Cognition  Cognition Arousal/Alertness: Awake/alert Behavior During Therapy: WFL for tasks assessed/performed Overall Cognitive Status: Impaired/Different from baseline Area of Impairment: Memory;Problem solving;Attention;Safety/judgement;Awareness Current Attention Level: Sustained Memory: Decreased short-term memory Safety/Judgement: Decreased awareness of  safety;Decreased awareness of deficits Awareness: Emergent Problem Solving: Slow processing;Difficulty sequencing;Requires verbal cues;Requires tactile cues General Comments: Decreased safety during mobility; delayed problem solving    Extremity/Trunk Assessment Upper Extremity Assessment Upper Extremity Assessment: Overall WFL for tasks assessed;LUE deficits/detail LUE Sensation:  (Pt c/o L UE feeling funny) Lower Extremity Assessment Lower Extremity Assessment: LLE deficits/detail LLE Sensation:  (c/o LLE feeling funny) Cervical / Trunk Assessment Cervical / Trunk Assessment: Normal     Mobility Bed Mobility Bed Mobility: Supine to Sit;Sitting - Scoot to [employer] of Bed;Sit to Supine Supine to Sit: HOB elevated;5: Supervision Sitting - Scoot to Edge of Bed: 5: Supervision Sit to Supine: 5: Supervision Details for Bed Mobility Assistance: increased time Transfers Sit to Stand: 4: Min assist;With upper extremity assist;From bed Stand to Sit: 4: Min assist;With upper extremity assist;To bed Details for Transfer Assistance: tactile and directional cues, delayed processing, impaired sequencing     Exercise     Balance Balance Balance Assessed: Yes Static Sitting Balance Static Sitting - Balance Support: Bilateral upper extremity supported;Feet supported Static Sitting - Level of Assistance: 5: Stand by assistance (pt leaning posteriorly - unaware) Static Sitting - Comment/# of Minutes: 7 Static Standing Balance Static Standing - Balance Support: No upper extremity supported Static Standing - Level of Assistance: 3: Mod assist Static Standing - Comment/# of Minutes: ant bias   End of Session OT - End of Session Equipment Utilized During Treatment: Gait belt Activity Tolerance: Patient tolerated treatment well Patient left: in chair;with call bell/phone within reach Nurse Communication: Mobility status  GO     Morad Tal,HILLARY 07/21/2013, 2:50 PM [facility], OTR/L   [id number] 07/21/2013 [place], OTR/L  [phone] 07/21/2013

## 2013-07-21 NOTE — Progress Notes (Signed)
Utilization review completed.  

## 2013-07-21 NOTE — Progress Notes (Signed)
Stroke Team Progress Note  HISTORY Kelly Blair is a 62 y.o. female with a history of diabetes, hypertension who has not been acting right for at least the past few days. She was able to go to work today 07/20/2013, however began having a severe headache with photophobia. On her way home, she became lost and when directed to local station, reported that she was unable to see it. It was clearly within her field of view according to the brother. She was therefore brought to the emergency room for further evaluation. She is complaining of headache on arrival, and was given morphine shortly prior to my exam and history. Patient was not a TPA candidate secondary to delay in arrival. She was admitted to the SDU for further evaluation and treatment.  SUBJECTIVE Her brother is at the bedside.  Overall she feels her condition is unchanged.   OBJECTIVE Most recent Vital Signs: Filed Vitals:   07/20/13 2250 07/20/13 2335 07/21/13 0451 07/21/13 0731  BP: 182/80 164/77 141/63 166/65  Pulse: 109 87 80 76  Temp: 98.5 F (36.9 C)  98.9 F (37.2 C) 98.3 F (36.8 C)  TempSrc: Axillary  Oral Oral  Resp: 17 26 14 19   Height: 5\' 4"  (1.626 m)     Weight: 88.5 kg (195 lb 1.7 oz)     SpO2: 97% 100% 98% 96%   CBG (last 3)   Recent Labs  07/20/13 2252 07/21/13 0450 07/21/13 0730  GLUCAP 297* 207* 167*    IV Fluid Intake:   . sodium chloride 75 mL/hr at 07/21/13 0007    MEDICATIONS  . aspirin  300 mg Rectal Daily   Or  . aspirin  325 mg Oral Daily  . insulin aspart  0-9 Units Subcutaneous Q4H  . insulin glargine  60 Units Subcutaneous QHS   PRN:  acetaminophen, acetaminophen, hydrALAZINE, HYDROcodone-acetaminophen, senna-docusate  Diet:  NPO  Activity:  OOB with assistance DVT Prophylaxis:  SCDs ordered, not on  CLINICALLY SIGNIFICANT STUDIES Basic Metabolic Panel:  Recent Labs Lab 07/20/13 1610  NA 134*  K 3.7  CL 96  CO2 25  GLUCOSE 513*  BUN 12  CREATININE 1.31*  CALCIUM 9.4    Liver Function Tests:  Recent Labs Lab 07/20/13 1610  AST 15  ALT 12  ALKPHOS 173*  BILITOT 0.2*  PROT 7.8  ALBUMIN 3.5   CBC:  Recent Labs Lab 07/20/13 1610  WBC 10.4  HGB 13.3  HCT 39.8  MCV 83.1  PLT 257   Coagulation: No results found for this basename: LABPROT, INR,  in the last 168 hours Cardiac Enzymes: No results found for this basename: CKTOTAL, CKMB, CKMBINDEX, TROPONINI,  in the last 168 hours Urinalysis:  Recent Labs Lab 07/20/13 1843  COLORURINE YELLOW  LABSPEC 1.022  PHURINE 7.0  GLUCOSEU >1000*  HGBUR SMALL*  BILIRUBINUR NEGATIVE  KETONESUR NEGATIVE  PROTEINUR >300*  UROBILINOGEN 0.2  NITRITE NEGATIVE  LEUKOCYTESUR NEGATIVE   Lipid Panel    Component Value Date/Time   CHOL 264* 07/21/2013 0530   TRIG 100 07/21/2013 0530   HDL 68 07/21/2013 0530   CHOLHDL 3.9 07/21/2013 0530   VLDL 20 07/21/2013 0530   LDLCALC 176* 07/21/2013 0530   HgbA1C  No results found for this basename: HGBA1C    Urine Drug Screen:   No results found for this basename: labopia, cocainscrnur, labbenz, amphetmu, thcu, labbarb    Alcohol Level: No results found for this basename: ETH,  in the last 168 hours  CT of the brain  07/20/2013    Area concerning for recent infarct in the mid right temporal lobe periphery. There is atrophy with periventricular small vessel disease. No hemorrhage or midline shift. Study otherwise unremarkable.  MRI of the brain    MRA of the brain    2D Echocardiogram    Carotid Doppler    CXR    EKG  Normal sinus rhythm Nonspecific T wave abnormality Prolonged QT Abnormal ECG  Therapy Recommendations   Physical Exam   Pleasant middle aged african american lady not in distress.Awake alert. Afebrile. Head is nontraumatic. Neck is supple without bruit. Hearing is normal. Cardiac exam no murmur or gallop. Lungs are clear to auscultation. Distal pulses are well felt. Neurological Exam : Oriented x3 with normal speech and language.  Diminished recall 1/3. Diminished minimal naming 8 only. Slight distractibility. Comprehension is intact. Speech is fluent. No dysarthria. Extraocular movements are full range without nystagmus. Left superior quadrantic field defect on bedside confrontational testing. No facial weakness. Tongue is midline. Motor system exam revealed no upper or lower eczema to drift. Symmetric and equal strength in all 4 extremities. No focal weakness. Sensation is intact. Gait was not tested. ASSESSMENT Kelly Blair is a 62 y.o. female presenting with "not acting right", confused, headache with photophobia. CT unrevealing; MRI pending. Symptoms are not clearly stroke related. Suspect temporal ischemic stroke, other lesion or migraine related episode.  On no antithrombotics prior to admission. Now on aspirin for secondary stroke prevention. Patient with resultant left upper quandrantopsia, short term memory loss. Work up underway.  Hypertension Hyperlipidemia, LDL 176, on no statin PTA, now on no statin, goal LDL < 100 (< 70 for diabetics) Diabetes, HgbA1c pending, goal < 7.0   Hospital day # 1  TREATMENT/PLAN  Continue asprin for now for secondary stroke prevention until dx confirmed/refuted  Add contrast to MRI. F/u MRA, 2D, carotid doppler, HgbA1c  Consider adding statin for elevated LDL  Ok to transfer to the floor from stroke standpoint  Therapy evals  Burnetta Sabin, MSN, RN, ANVP-BC, ANP-BC, [facility] Pager: [phone] 07/21/2013 9:03 AM  I have personally obtained a history, examined the patient, evaluated imaging results, and formulated the assessment and plan of care. I agree with the above.  Antony Contras, MD

## 2013-07-21 NOTE — Progress Notes (Signed)
Rehab Admissions Coordinator Note:  Patient was screened by Cleatrice Burke for appropriateness for an Inpatient Acute Rehab Consult.  At this time, we are recommending Inpatient Rehab consult.  Cleatrice Burke 07/21/2013, 2:35 PM  I can be reached at [phone].

## 2013-07-21 NOTE — Evaluation (Signed)
Physical Therapy Evaluation Patient Details Name: Kelly Blair MRN: [id number] DOB: 06-Mar-1951 Today's Date: 07/21/2013 Time: [id number] PT Time Calculation (min): 26 min  PT Assessment / Plan / Recommendation History of Present Illness  Pt admitted with headache and confusion. Suspect R temporal lobe lession.  Clinical Impression  Pt with significant processing and sequencing deficits. Pt with L superior field cut as well. Pt currently requiring mod/maxA for safe transfers and ambulation at this time. Pt was indep and working PTA.  Pt an excellent candidate for CIR to achieve safe mod I function for safe transition home.    PT Assessment  Patient needs continued PT services    Follow Up Recommendations  CIR;Supervision/Assistance - 24 hour    Does the patient have the potential to tolerate intense rehabilitation      Barriers to Discharge Decreased caregiver support      Equipment Recommendations   (TBD)    Recommendations for Other Services Rehab consult   Frequency Min 4X/week    Precautions / Restrictions Precautions Precautions: Fall Precaution Comments: per Dr. Leonie Man pt with L superior field cut Restrictions Weight Bearing Restrictions: No   Pertinent Vitals/Pain Denies pain      Mobility  Bed Mobility Bed Mobility: Supine to Sit;Sitting - Scoot to Edge of Bed;Sit to Supine Supine to Sit: 2: Max assist;HOB flat Sitting - Scoot to Edge of Bed: 3: Mod assist Sit to Supine: 3: Mod assist Details for Bed Mobility Assistance:  max directional v/c's. Assist for trunk elevation and to bring hips to EOB. Pt reports "i forgot what you wanted me to do." Transfers Transfers: Sit to Stand;Stand to Sit Sit to Stand: 4: Min assist;With upper extremity assist;From bed Stand to Sit: 4: Min assist;With upper extremity assist;To bed Details for Transfer Assistance: tactile and directional cues, delayed processing, impaired sequencing Ambulation/Gait Ambulation/Gait  Assistance: 2: Max assist (2nd person helpful for lines) Ambulation Distance (Feet): 50 Feet Assistive device: Rolling walker;None Ambulation/Gait Assistance Details: pt extremely unsteady without RW however when given RW pt just as unsteady due to inability to use RW properly. Pt with significant sequencing impairment as well as comprehension of directional v/c's to manage walker. Pt with desire to vear left. max v/c's to stay in walker Gait Pattern: Step-through pattern;Decreased stride length (decreased step height, crossover) Gait velocity: slow General Gait Details: significant falls risk Stairs: No Modified Rankin (Stroke Patients Only) Pre-Morbid Rankin Score: No symptoms Modified Rankin: Moderately severe disability    Exercises     PT Diagnosis: Difficulty walking  PT Problem List: Decreased strength;Decreased activity tolerance;Decreased balance;Decreased mobility;Decreased cognition PT Treatment Interventions: DME instruction;Gait training;Stair training;Functional mobility training;Therapeutic activities;Therapeutic exercise;Balance training;Neuromuscular re-education;Cognitive remediation     PT Goals(Current goals can be found in the care plan section) Acute Rehab PT Goals Patient Stated Goal: home PT Goal Formulation: With patient Time For Goal Achievement: 07/28/13 Potential to Achieve Goals: Good  Visit Information  Last PT Received On: 07/21/13 Assistance Needed: +1 History of Present Illness: Pt admitted with headache and confusion. Suspect R temporal lobe lession.       Prior [place] expects to be discharged to:: Private residence Living Arrangements: Other relatives (cousin who isn't healthy) Available Help at Discharge: Family;Available PRN/intermittently Type of Home: House Home Access: Ramped entrance Home Layout: One level Home Equipment: None Prior Function Level of Independence: Independent Comments: works at  [employer]: No difficulties Dominant Hand: Right    Cognition  Cognition Arousal/Alertness: Awake/alert Behavior During  Therapy: WFL for tasks assessed/performed Overall Cognitive Status: Impaired/Different from baseline Area of Impairment: Memory;Problem solving Memory: Decreased short-term memory Problem Solving: Slow processing;Difficulty sequencing;Requires verbal cues;Requires tactile cues    Extremity/Trunk Assessment Upper Extremity Assessment Upper Extremity Assessment: Overall WFL for tasks assessed Lower Extremity Assessment Lower Extremity Assessment: Overall WFL for tasks assessed Cervical / Trunk Assessment Cervical / Trunk Assessment: Normal   Balance Balance Balance Assessed: Yes Static Sitting Balance Static Sitting - Balance Support: Bilateral upper extremity supported;Feet supported Static Sitting - Level of Assistance: 4: Min assist Static Sitting - Comment/# of Minutes: pt with increased trunk flex, tendency to lean L Static Standing Balance Static Standing - Balance Support: No upper extremity supported Static Standing - Level of Assistance: 3: Mod assist Static Standing - Comment/# of Minutes: lateral L sway, increased trunk flexion  End of Session PT - End of Session Equipment Utilized During Treatment: Gait belt Activity Tolerance: Patient limited by fatigue;Patient limited by lethargy Patient left: in bed;with family/visitor present;with call bell/phone within reach Nurse Communication: Mobility status  GP     Kingsley Callander 07/21/2013, 9:31 AM  Kittie Plater, PT, DPT Pager #: [phone] Office #: [phone]

## 2013-07-21 NOTE — Evaluation (Signed)
Speech Language Pathology Evaluation Patient Details Name: Kelly Blair MRN: [id number] DOB: 1950-09-05 Today's Date: 07/21/2013 Time: 1005-1015 SLP Time Calculation (min): 10 min  Problem List:  Patient Active Problem List   Diagnosis Date Noted  . CVA (cerebral infarction) 07/20/2013  . Hypertension 07/20/2013  . Diabetes mellitus due to underlying condition with mild nonproliferative diabetic retinopathy without macular edema 07/20/2013  . Screening for colorectal cancer 01/08/2011   Past Medical History:  Past Medical History  Diagnosis Date  . Diabetes mellitus   . Hypertension   . Hypercholesterolemia    Past Surgical History:  Past Surgical History  Procedure Laterality Date  . Fibroid removal from uterus    . Left carpal tunnel repair    . Pinched nerve left arm     HPI:  62 y.o. female has a past medical history of Diabetes mellitus; Hypertension; and Hypercholesterolemia.  Presented with intermittent headaches for the past 3 weeks. Today she fell in the bathroom. Once she came to her wok she was confused and had trouble seeing she also had a headache. She has not been taking her insulin and she does not know what she supposed to take for her hypertension. Since patient arrived to ER she has been somewhat somnolent, prior to my evaluation she was also given Ativan 1 mg IV. At this point she is unable to cooperate with exam or give accurate history she does state that she could not see due to sun being in her eyes.    CT scan was done that showed area concerning for recent infarct in the mid right temporal lobe.  MRI pending.    Assessment / Plan / Recommendation Clinical Impression  Pt. demonstrated mild-moderate cognitive deficits during assessment with brother present.  Intellectual and anticipatory awareness decreased with evidence of emergent awareness.  Deficits also detected with working and [employer] and higher level executive functions.  Pt. would  benefit from continued ST to increase independence.    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[employer])   Cognition  Overall Cognitive Status: Impaired/Different from baseline Arousal/Alertness:  (awake but drowsy) Orientation Level: Oriented to person;Oriented to place;Oriented to time;Disoriented to situation Attention: Sustained Sustained Attention: Appears intact Memory: Impaired Memory Impairment: Decreased short term memory;Decreased recall of new information (need to assess storage or retrieval) Decreased Short Term Memory: Verbal basic Awareness: Impaired Awareness Impairment: Anticipatory impairment;Emergent impairment;Intellectual impairment Problem Solving: Appears intact (verbal intact but likely deficits during performance) Executive Function: Decision Making;Self Monitoring;Self Correcting;Organizing Safety/Judgment:  (to be assessed further)    Comprehension  Auditory Comprehension Overall Auditory Comprehension: Appears within functional limits for tasks assessed Visual Recognition/Discrimination Discrimination: Not tested Reading Comprehension Reading Status:  (TBA)    Expression Expression Primary Mode of Expression: Verbal Verbal Expression Overall Verbal Expression: Appears within functional limits for tasks assessed Level of Generative/Spontaneous Verbalization: Conversation Naming:  (not exhibited) Pragmatics: No impairment Written Expression Dominant Hand: Right Written Expression:  (TBA)    Oral / Motor Oral Motor/Sensory Function Overall Oral Motor/Sensory Function: Appears within functional limits for tasks assessed Motor Speech  Overall Motor Speech: Appears within functional limits for tasks assessed Respiration: Within functional limits Phonation: Normal Resonance: Within functional limits Articulation: Within functional limitis Intelligibility: Intelligible Motor Planning: Witnin functional limits   GO     Kelly Blair.Ed [employer] [phone]  07/21/2013

## 2013-07-21 NOTE — Plan of Care (Signed)
Problem: Food- and Nutrition-Related Knowledge Deficit (NB-1.1) Goal: Nutrition education Formal process to instruct or train a patient/client in a skill or to impart knowledge to help patients/clients voluntarily manage or modify food choices and eating behavior to maintain or improve health. Outcome: Completed/Met Date Met:  07/21/13 Nutrition Education Note  RD consulted for nutrition education regarding a diet for diabetes, HTN, and hyperlipidemia.   Lipid Panel     Component Value Date/Time    CHOL 264* 07/21/2013 0530    TRIG 100 07/21/2013 0530    HDL 68 07/21/2013 0530    CHOLHDL 3.9 07/21/2013 0530    VLDL 20 07/21/2013 0530    LDLCALC 176* 07/21/2013 0530       Lab Results  Component Value Date    HGBA1C 11.5* 07/21/2013     Body mass index is 33.47 kg/(m^2). Pt meets criteria for obesity, class 1 based on current BMI.  RD provided "Stroke Nutrition Therapy" handout from the Academy of Nutrition and Dietetics. Reviewed patient's dietary recall. Provided examples on ways to decrease sodium and fat intake in diet. Discouraged intake of processed foods and use of salt shaker. Encouraged fresh fruits and vegetables as well as whole grain sources of carbohydrates to maximize fiber intake. Teach back method used.  Discussed importance of controlled and consistent carbohydrate intake throughout the day. Provided examples of ways to balance meals/snacks and encouraged intake of high-fiber, whole grain complex carbohydrates. Teach back method used.  Expect fair compliance.  Current diet order is CHO-modified, patient is consuming approximately 75% of meals at this time. Labs and medications reviewed. No further nutrition interventions warranted at this time. RD contact information provided. If additional nutrition issues arise, please re-consult RD. Will order OP nutrition counseling for diabetes.   Molli Barrows, RD, LDN, [place] Pager [phone] After Hours Pager [phone]

## 2013-07-21 NOTE — Progress Notes (Signed)
Patient transferred to 4N per MD orders. RN called report to receiving nurse, all questions answered.

## 2013-07-21 NOTE — Evaluation (Signed)
Clinical/Bedside Swallow Evaluation Patient Details  Name: FRANKA MCDANNEL MRN: [id number] Date of Birth: [birth year]  Today's Date: 07/21/2013 Time: 0950-1005 SLP Time Calculation (min): 15 min  Past Medical History:  Past Medical History  Diagnosis Date  . Diabetes mellitus   . Hypertension   . Hypercholesterolemia    Past Surgical History:  Past Surgical History  Procedure Laterality Date  . Fibroid removal from uterus    . Left carpal tunnel repair    . Pinched nerve left arm     HPI:  62 y.o. female has a past medical history of Diabetes mellitus; Hypertension; and Hypercholesterolemia.  Presented with intermittent headaches for the past 3 weeks. Today she fell in the bathroom. Once she came to her wok she was confused and had trouble seeing she also had a headache. She has not been taking her insulin and she does not know what she supposed to take for her hypertension. Since patient arrived to ER she has been somewhat somnolent, prior to my evaluation she was also given Ativan 1 mg IV. At this point she is unable to cooperate with exam or give accurate history she does state that she could not see due to sun being in her eyes.    CT scan was done that showed area concerning for recent infarct in the mid right temporal lobe.  MRI pending.    Assessment / Plan / Recommendation Clinical Impression  Pt. cooperative, awake and somewhat drowsy during swallow assessment.  No indications of oropharyngeal dysphagia during completion of 4 oz applesauce, water via straw and cracker.  Recommend regular solid texture and thin liquids, straws ok and pills with thin.  ST will see once more due to likely diagnosis of CVA and mild lethargy to ensure safe recommendations.    Aspiration Risk  Mild    Diet Recommendation Regular;Thin liquid   Liquid Administration via: Cup;Straw Medication Administration: Whole meds with liquid Supervision: Patient able to self feed;Intermittent supervision to  cue for compensatory strategies Compensations: Slow rate;Small sips/bites Postural Changes and/or Swallow Maneuvers: Seated upright 90 degrees    Other  Recommendations Oral Care Recommendations: Oral care BID   Follow Up Recommendations  None    Frequency and Duration min 1 x/week  1 week   Pertinent Vitals/Pain No pain         Swallow Study Prior Functional Status  Type of Home: House Available Help at Discharge: Family;Available PRN/intermittently       Oral/Motor/Sensory Function Overall Oral Motor/Sensory Function: Appears within functional limits for tasks assessed   Ice Chips Ice chips: Not tested   Thin Liquid Thin Liquid: Within functional limits Presentation: Cup;Straw    Nectar Thick Nectar Thick Liquid: Not tested   Honey Thick Honey Thick Liquid: Not tested   Puree Puree: Within functional limits   Solid   GO    Solid: Within functional limits       Houston Siren M.Ed [employer] [phone]  07/21/2013

## 2013-07-21 NOTE — Progress Notes (Addendum)
[facility] Progress Note [place] TEAM 1 - Stepdown/ICU TEAM   Kelly Blair [id number] DOB: 02/13/1951 DOA: 07/20/2013 PCP: Elyn Peers, MD  Brief narrative: Is a 62 year old female with a history of diabetes hypertension and hyper lipidemia who presents with a headache and trouble with vision. History is obtained today from her brothers who states that the patient has not been taking any of her medications for months. She has been having headaches recently and the morning prior to admission had trouble with ambulating but nonetheless went to work. At work she continued to feel bad and therefore decided to go home. When driving home she had trouble with vision and was unable to see a stop sign that was within her visual field. She was admitted to the hospital with a presumptive diagnosis of a CVA. The patient states that she's been under a great deal of stress recently which is why she has not been taking her medications. She does have her insulin at home but does not have any syringes.  Assessment/Plan: Principal Problem:   TIA (transient ischemic attack) - cont baby ASA- discussed strict control of her major medical problems to prevent a CVA  Active Problems:   Hypertension - pt thinks she was on Cozaar samples - cont Cozaar and add Norvasc  CKD 3 - baseline uncertain but pt was told she has kidney problems    Diabetes mellitus due to underlying condition with mild nonproliferative diabetic retinopathy without macular edema - Uncontrolled - HbA1c- 11.5 - cont Lantus and follow sugars    Other and unspecified hyperlipidemia - start Crestor    Morbid obesity - dietician consult for instruction     Non-adherence to medical treatment - will need HHRN to ensure she taking meds- brothers will follow closely as well     Code Status: full code Family Communication: borthers Disposition Plan: likely home-   Consultants: Neuro  Antibiotics: none  DVT  prophylaxis: SCDs  HPI/Subjective: Pt alert - not fully disclosing history in regards to medication non-compliance- has no physical complaints but complains about stress in her life  Objective: Blood pressure 185/81, pulse 76, temperature 98.5 F (36.9 C), temperature source Oral, resp. rate 18, height 5\' 4"  (1.626 m), weight 88.5 kg (195 lb 1.7 oz), SpO2 100.00%.  Intake/Output Summary (Last 24 hours) at 07/21/13 1859 Last data filed at 07/21/13 1700  Gross per 24 hour  Intake   1140 ml  Output    450 ml  Net    690 ml     Exam: General: No acute respiratory distress Lungs: Clear to auscultation bilaterally without wheezes or crackles Cardiovascular: Regular rate and rhythm without murmur gallop or rub normal S1 and S2 Abdomen: Nontender, nondistended, soft, bowel sounds positive, no rebound, no ascites, no appreciable mass Extremities: No significant cyanosis, clubbing, or edema bilateral lower extremities  Data Reviewed: Basic Metabolic Panel:  Recent Labs Lab 07/20/13 1610 07/21/13 1330  NA 134* 135  K 3.7 4.1  CL 96 102  CO2 25 24  GLUCOSE 513* 262*  BUN 12 11  CREATININE 1.31* 1.30*  CALCIUM 9.4 8.6   Liver Function Tests:  Recent Labs Lab 07/20/13 1610  AST 15  ALT 12  ALKPHOS 173*  BILITOT 0.2*  PROT 7.8  ALBUMIN 3.5   No results found for this basename: LIPASE, AMYLASE,  in the last 168 hours No results found for this basename: AMMONIA,  in the last 168 hours CBC:  Recent Labs Lab 07/20/13 1610  WBC 10.4  HGB 13.3  HCT 39.8  MCV 83.1  PLT 257   Cardiac Enzymes: No results found for this basename: CKTOTAL, CKMB, CKMBINDEX, TROPONINI,  in the last 168 hours BNP (last 3 results) No results found for this basename: PROBNP,  in the last 8760 hours CBG:  Recent Labs Lab 07/20/13 2252 07/21/13 0450 07/21/13 0730 07/21/13 1216 07/21/13 1534  GLUCAP 297* 207* 167* 221* 239*    Recent Results (from the past 240 hour(s))  MRSA PCR  SCREENING     Status: None   Collection Time    07/21/13  6:22 AM      Result Value Range Status   MRSA by PCR NEGATIVE  NEGATIVE Final   Comment:            The GeneXpert MRSA Assay (FDA     approved for NASAL specimens     only), is one component of a     comprehensive MRSA colonization     surveillance program. It is not     intended to diagnose MRSA     infection nor to guide or     monitor treatment for     MRSA infections.     Studies:  Recent x-ray studies have been reviewed in detail by the Attending Physician  Scheduled Meds:  Scheduled Meds: . amLODipine  5 mg Oral Daily  . aspirin  81 mg Oral Daily  . atorvastatin  10 mg Oral q1800  . insulin aspart  0-9 Units Subcutaneous Q4H  . insulin glargine  60 Units Subcutaneous QHS   Continuous Infusions:   Time spent on care of this patient: 53 min   Debbe Odea, MD  [facility] Office  [phone] Pager - Text Page per Shea Evans as per below:  On-Call/Text Page:      [email or web address]      password [email or web address]  If 7PM-7AM, please contact night-coverage [email or web address] Password [email or web address] 07/21/2013, 6:59 PM   LOS: 1 day

## 2013-07-22 DIAGNOSIS — R5381 Other malaise: Secondary | ICD-10-CM

## 2013-07-22 LAB — GLUCOSE, CAPILLARY: Glucose-Capillary: 164 mg/dL — ABNORMAL HIGH (ref 70–99)

## 2013-07-22 MED ORDER — LOSARTAN POTASSIUM 50 MG PO TABS: 100.0000 mg | ORAL_TABLET | Freq: Every day | ORAL | Status: DC

## 2013-07-22 MED ORDER — AMLODIPINE BESYLATE 10 MG PO TABS: 10.0000 mg | ORAL_TABLET | Freq: Every day | ORAL | Status: AC

## 2013-07-22 MED ORDER — ASPIRIN 81 MG PO CHEW: 81.0000 mg | CHEWABLE_TABLET | Freq: Every day | ORAL | Status: DC

## 2013-07-22 MED ORDER — AMLODIPINE BESYLATE 10 MG PO TABS: 10.0000 mg | ORAL_TABLET | Freq: Every day | ORAL | Status: DC

## 2013-07-22 MED ORDER — ATORVASTATIN CALCIUM 10 MG PO TABS: 10.0000 mg | ORAL_TABLET | Freq: Every day | ORAL | Status: DC

## 2013-07-22 MED ORDER — HYDROCODONE-ACETAMINOPHEN 5-325 MG PO TABS: 1.0000 | ORAL_TABLET | ORAL | Status: DC | PRN

## 2013-07-22 MED ORDER — LOSARTAN POTASSIUM 100 MG PO TABS: 100.0000 mg | ORAL_TABLET | Freq: Every day | ORAL | Status: DC

## 2013-07-22 NOTE — Progress Notes (Signed)
Talked to patient about discharge planning, patient is returning home at discharge and needs outpatient physical therapy; Patient is agreeable to outpatient PT at the [facility]; clinical information and orders faxed; Patient is to also follow up with Outpatient Diabetes Management; B Lamonte Sakai, MHA

## 2013-07-22 NOTE — Progress Notes (Addendum)
Stroke Team Progress Note  HISTORY Kelly Blair is a 62 y.o. female with a history of diabetes, hypertension who has not been acting right for at least the past few days. She was able to go to work today 07/20/2013, however began having a severe headache with photophobia. On her way home, she became lost and when directed to local station, reported that she was unable to see it. It was clearly within her field of view according to the brother. She was therefore brought to the emergency room for further evaluation. She is complaining of headache on arrival, and was given morphine shortly prior to my exam and history. Patient was not a TPA candidate secondary to delay in arrival. She was admitted to the SDU for further evaluation and treatment.  SUBJECTIVE No family at bedside. Pt reports her headache is back this am.  OBJECTIVE Most recent Vital Signs: Filed Vitals:   07/21/13 1700 07/21/13 2131 07/22/13 0300 07/22/13 0612  BP: 185/81 172/65 183/84 174/71  Pulse: 76 74 70 68  Temp: 98.5 F (36.9 C) 98.2 F (36.8 C) 98.4 F (36.9 C) 98.4 F (36.9 C)  TempSrc: Oral Oral Oral Oral  Resp: 18 18 20 18   Height:      Weight:      SpO2: 100% 95% 95% 99%   CBG (last 3)   Recent Labs  07/21/13 1534 07/21/13 2131 07/22/13 0649  GLUCAP 239* 280* 150*    IV Fluid Intake:      MEDICATIONS  . [START ON 07/23/2013] amLODipine  10 mg Oral Daily  . aspirin  81 mg Oral Daily  . atorvastatin  10 mg Oral q1800  . insulin aspart  0-9 Units Subcutaneous Q4H  . insulin glargine  60 Units Subcutaneous QHS  . losartan  50 mg Oral Daily   PRN:  acetaminophen, acetaminophen, hydrALAZINE, HYDROcodone-acetaminophen, senna-docusate  Diet:  Carb Control  Activity:  OOB with assistance DVT Prophylaxis:  SCDs ordered, not on  CLINICALLY SIGNIFICANT STUDIES Basic Metabolic Panel:   Recent Labs Lab 07/20/13 1610 07/21/13 1330  NA 134* 135  K 3.7 4.1  CL 96 102  CO2 25 24  GLUCOSE 513*  262*  BUN 12 11  CREATININE 1.31* 1.30*  CALCIUM 9.4 8.6   Liver Function Tests:   Recent Labs Lab 07/20/13 1610  AST 15  ALT 12  ALKPHOS 173*  BILITOT 0.2*  PROT 7.8  ALBUMIN 3.5   CBC:   Recent Labs Lab 07/20/13 1610  WBC 10.4  HGB 13.3  HCT 39.8  MCV 83.1  PLT 257   Coagulation: No results found for this basename: LABPROT, INR,  in the last 168 hours Cardiac Enzymes: No results found for this basename: CKTOTAL, CKMB, CKMBINDEX, TROPONINI,  in the last 168 hours Urinalysis:   Recent Labs Lab 07/20/13 1843  COLORURINE YELLOW  LABSPEC 1.022  PHURINE 7.0  GLUCOSEU >1000*  HGBUR SMALL*  BILIRUBINUR NEGATIVE  KETONESUR NEGATIVE  PROTEINUR >300*  UROBILINOGEN 0.2  NITRITE NEGATIVE  LEUKOCYTESUR NEGATIVE   Lipid Panel    Component Value Date/Time   CHOL 264* 07/21/2013 0530   TRIG 100 07/21/2013 0530   HDL 68 07/21/2013 0530   CHOLHDL 3.9 07/21/2013 0530   VLDL 20 07/21/2013 0530   LDLCALC 176* 07/21/2013 0530   HgbA1C  Lab Results  Component Value Date   HGBA1C 11.5* 07/21/2013    Urine Drug Screen:   No results found for this basename: labopia,  cocainscrnur,  labbenz,  amphetmu,  thcu,  labbarb    Alcohol Level: No results found for this basename: ETH,  in the last 168 hours  CT of the brain  07/20/2013    Area concerning for recent infarct in the mid right temporal lobe periphery. There is atrophy with periventricular small vessel disease. No hemorrhage or midline shift. Study otherwise unremarkable.  MRI of the brain    MRA of the brain    2D Echocardiogram    Carotid Doppler    CXR    EKG  Normal sinus rhythm Nonspecific T wave abnormality Prolonged QT Abnormal ECG  Therapy Recommendations   Physical Exam   Pleasant middle aged african american lady not in distress.Awake alert. Afebrile. Head is nontraumatic. Neck is supple without bruit. Hearing is normal. Cardiac exam no murmur or gallop. Lungs are clear to auscultation.  Distal pulses are well felt. Neurological Exam : Oriented x3 with normal speech and language. Diminished recall 1/3. Diminished minimal naming 8 only. Slight distractibility. Comprehension is intact. Speech is fluent. No dysarthria. Extraocular movements are full range without nystagmus. Left superior quadrantic field defect on bedside confrontational testing. No facial weakness. Tongue is midline. Motor system exam revealed no upper or lower eczema to drift. Symmetric and equal strength in all 4 extremities. No focal weakness. Sensation is intact. Gait was not tested.  ASSESSMENT [name] is a 63 y.o. female presenting with "not acting right", confused, headache with photophobia. CT unrevealing; MRI negative for acute stroke. Do not feel pt has had a TIA. Symptoms are not clearly stroke related. DX:  migraine related episode.  On no antithrombotics prior to admission. Now on aspirin for secondary stroke prevention. Patient with resultant left upper quandrantopsia, short term memory loss. Work up completed.  Hypertension Hyperlipidemia, LDL 176, on no statin PTA, now on no statin, goal LDL < 100 (< 70 for diabetics) Diabetes, HgbA1c11.5, goal < 7.0   Hospital day # 2  TREATMENT/PLAN Continue asprin . No further stroke workup indicated. Ongoing risk factor control by Primary Care Physician Stroke Service will sign off. Please call should any needs arise.  No stroke follow up indicated. Please refer to neurology if needed in the future for headache management.  Burnetta Sabin, MSN, RN, ANVP-BC, ANP-BC, [facility] Pager: [phone] 07/22/2013 11:27 AM  I have personally obtained a history, examined the patient, evaluated imaging results, and formulated the assessment and plan of care. I agree with the above. Antony Contras, MD

## 2013-07-22 NOTE — Progress Notes (Signed)
Patient not in need of an inpt rehab admission at this time. Call me with any questions. [phone]

## 2013-07-22 NOTE — Progress Notes (Addendum)
[facility] Progress Note    Kelly Blair [id number] DOB: 12/19/1950 DOA: 07/20/2013 PCP: Elyn Peers, MD  Brief narrative: Is a 62 year old female with a history of diabetes hypertension and hyper lipidemia who presents with a headache and trouble with vision. History is obtained today from her brothers who states that the patient has not been taking any of her medications for months. She has been having headaches recently and the morning prior to admission had trouble with ambulating but nonetheless went to work. At work she continued to feel bad and therefore decided to go home. When driving home she had trouble with vision and was unable to see a stop sign that was within her visual field. She was admitted to the hospital with a presumptive diagnosis of a CVA. The patient states that she's been under a great deal of stress recently which is why she has not been taking her medications. She does have her insulin at home but does not have any syringes.  Assessment/Plan: Principal Problem:   TIA (transient ischemic attack)- MRI negative for CVA but has atrophy and chronic changes - cont baby ASA- discussed strict control of her major medical problems to prevent a CVA    Hypertension - pt thinks she was on Cozaar samples - cont Cozaar and increase Norvasc  CKD 3 - baseline uncertain but pt was told she has kidney problems Appears stable    Diabetes mellitus due to underlying condition with mild nonproliferative diabetic retinopathy without macular edema - Uncontrolled - HbA1c- 11.5 - cont Lantus and follow sugars    Other and unspecified hyperlipidemia - start Crestor    Morbid obesity - dietician consult for instruction     Non-adherence to medical treatment     Code Status: full code Family Communication: borthers Disposition Plan: CIR eval?   Consultants: Neuro  Antibiotics: none  DVT prophylaxis: SCDs  HPI/Subjective: Doing well, no new  c/o  Objective: Blood pressure 174/71, pulse 68, temperature 98.4 F (36.9 C), temperature source Oral, resp. rate 18, height 5\' 4"  (1.626 m), weight 88.5 kg (195 lb 1.7 oz), SpO2 99.00%.  Intake/Output Summary (Last 24 hours) at 07/22/13 0853 Last data filed at 07/22/13 0800  Gross per 24 hour  Intake    660 ml  Output    450 ml  Net    210 ml     Exam: General: No acute respiratory distress Lungs: Clear to auscultation bilaterally without wheezes or crackles Cardiovascular: Regular rate and rhythm without murmur gallop or rub normal S1 and S2 Abdomen: Nontender, nondistended, soft, bowel sounds positive, no rebound, no ascites, no appreciable mass Extremities: No significant cyanosis, clubbing, or edema bilateral lower extremities Neuro- good strength in all 4 ext  Data Reviewed: Basic Metabolic Panel:  Recent Labs Lab 07/20/13 1610 07/21/13 1330  NA 134* 135  K 3.7 4.1  CL 96 102  CO2 25 24  GLUCOSE 513* 262*  BUN 12 11  CREATININE 1.31* 1.30*  CALCIUM 9.4 8.6   Liver Function Tests:  Recent Labs Lab 07/20/13 1610  AST 15  ALT 12  ALKPHOS 173*  BILITOT 0.2*  PROT 7.8  ALBUMIN 3.5   No results found for this basename: LIPASE, AMYLASE,  in the last 168 hours No results found for this basename: AMMONIA,  in the last 168 hours CBC:  Recent Labs Lab 07/20/13 1610  WBC 10.4  HGB 13.3  HCT 39.8  MCV 83.1  PLT 257   Cardiac Enzymes: No results  found for this basename: CKTOTAL, CKMB, CKMBINDEX, TROPONINI,  in the last 168 hours BNP (last 3 results) No results found for this basename: PROBNP,  in the last 8760 hours CBG:  Recent Labs Lab 07/21/13 0730 07/21/13 1216 07/21/13 1534 07/21/13 2131 07/22/13 0649  GLUCAP 167* 221* 239* 280* 150*    Recent Results (from the past 240 hour(s))  MRSA PCR SCREENING     Status: None   Collection Time    07/21/13  6:22 AM      Result Value Range Status   MRSA by PCR NEGATIVE  NEGATIVE Final   Comment:             The GeneXpert MRSA Assay (FDA     approved for NASAL specimens     only), is one component of a     comprehensive MRSA colonization     surveillance program. It is not     intended to diagnose MRSA     infection nor to guide or     monitor treatment for     MRSA infections.     Studies:  Recent x-ray studies have been reviewed in detail by the Attending Physician  Scheduled Meds:  Scheduled Meds: . amLODipine  5 mg Oral Daily  . aspirin  81 mg Oral Daily  . atorvastatin  10 mg Oral q1800  . insulin aspart  0-9 Units Subcutaneous Q4H  . insulin glargine  60 Units Subcutaneous QHS  . losartan  50 mg Oral Daily   Continuous Infusions:   Time spent on care of this patient: 35 min   Juston Goheen, DO  [facility] [phone]  If 7PM-7AM, please contact night-coverage [email or web address] Password [email or web address] 07/22/2013, 8:53 AM   LOS: 2 days

## 2013-07-22 NOTE — Consult Note (Signed)
Physical Medicine and Rehabilitation Consult Reason for Consult: CVA Referring Physician: [facility]   HPI: Kelly Blair is a 62 y.o. right-handed female history of hypertension as well as diabetes mellitus with peripheral neuropathy. Patient independent working full time prior to admission. Admitted 07/20/2013 with intermittent headaches x3 weeks. Noted patient with fall in the bathroom getting ready for work with noted altered mental status. Patient with elevated systolic blood pressure of 215. MRI negative for acute changes or chronic infarct. MRA of the head negative. Patient did not receive TPA. Neurology services consulted with workup ongoing and echocardiogram carotid Dopplers pending. Patient currently maintained on aspirin therapy. Physical occupational therapy evaluations completed recommendations of physical medicine rehabilitation consult.  Neurology has not completed rounding today. OT and PT notes reviewed, . inconsistent performance noted   Review of Systems  Eyes: Positive for double vision.  Neurological: Positive for headaches.  All other systems reviewed and are negative.   Past Medical History  Diagnosis Date  . Diabetes mellitus   . Hypertension   . Hypercholesterolemia    Past Surgical History  Procedure Laterality Date  . Fibroid removal from uterus    . Left carpal tunnel repair    . Pinched nerve left arm     Family History  Problem Relation Age of Onset  . Uterine cancer Mother     deceased, natural causes, dementia  . Diabetes Mother   . Heart disease Father     deceased secondary to septicemia   Social History:  reports that she has never smoked. She does not have any smokeless tobacco history on file. She reports that she does not drink alcohol or use illicit drugs. Allergies: No Known Allergies Medications Prior to Admission  Medication Sig Dispense Refill  . Canagliflozin 300 MG TABS Take 300 mg by mouth every morning.      . naproxen sodium  (ANAPROX) 220 MG tablet Take 220 mg by mouth 2 (two) times daily as needed (pain).      Marland Kitchen sitaGLIPtan (JANUVIA) 100 MG tablet Take 100 mg by mouth daily.        . insulin glargine (LANTUS) 100 UNIT/ML injection Inject 60 Units into the skin at bedtime.          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[employer]) Comments: works at [employer] Status:  Mobility: Bed Mobility Bed Mobility: Supine to Sit;Sitting - Scoot to Edge of Bed;Sit to Supine Supine to Sit: HOB elevated;5: Supervision Sitting - Scoot to Edge of Bed: 5: Supervision Sit to Supine: 5: Supervision Transfers Transfers: Sit to Stand;Stand to Sit Sit to Stand: 4: Min assist;With upper extremity assist;From bed Stand to Sit: 4: Min assist;With upper extremity assist;To bed Ambulation/Gait Ambulation/Gait Assistance: 2: Max assist (2nd person helpful for lines) Ambulation Distance (Feet): 50 Feet Assistive device: Rolling walker;None Ambulation/Gait Assistance Details: pt extremely unsteady without RW however when given RW pt just as unsteady due to inability to use RW properly. Pt with significant sequencing impairment as well as comprehension of directional v/c's to manage walker. Pt with desire to vear left. max v/c's to stay in walker Gait Pattern: Step-through pattern;Decreased stride length (decreased step height, crossover) Gait velocity:  slow General Gait Details: significant falls risk Stairs: No    ADL: ADL Eating/Feeding: Independent Where Assessed - Eating/Feeding: Chair Grooming: Set up Where Assessed -  Grooming: Supported sitting Upper Body Bathing: Set up Where Assessed - Upper Body Bathing: Unsupported sitting Lower Body Bathing: Moderate assistance Where Assessed - Lower Body Bathing: Supported sit to stand Upper Body Dressing: Minimal assistance Where Assessed - Upper Body Dressing: Supported sitting Lower Body Dressing: Moderate assistance Where Assessed - Lower Body Dressing: Supported sit to Lobbyist: Moderate assistance Toilet Transfer Method: Sit to stand Toilet Transfer Equipment: Other (comment) (bed - chair) Equipment Used: Gait belt Transfers/Ambulation Related to ADLs: min A transfer sit - stand.; Max at times with mobility. Staggering gait with forward osture. Poor awreness of self during ambulation; high risk for falls ADL Comments: decreased safety with ADL  Cognition: Cognition Overall Cognitive Status: Impaired/Different from baseline Arousal/Alertness:  (awake but drowsy) Orientation Level: Oriented X4 Attention: Sustained Sustained Attention: Appears intact Memory: Impaired Memory Impairment: Decreased short term memory;Decreased recall of new information (need to assess storage or retrieval) Decreased Short Term Memory: Verbal basic Awareness: Impaired Awareness Impairment: Anticipatory impairment;Emergent impairment;Intellectual impairment Problem Solving: Appears intact (verbal intact but likely deficits during performance) Executive Function: Decision Making;Self Monitoring;Self Correcting;Organizing Safety/Judgment:  (to be assessed further) Cognition Arousal/Alertness: Awake/alert Behavior During Therapy: WFL for tasks assessed/performed Overall Cognitive Status: Impaired/Different from baseline Area of Impairment: Memory;Problem solving;Attention;Safety/judgement;Awareness Current Attention Level: Sustained Memory: Decreased short-term memory Safety/Judgement: Decreased awareness of safety;Decreased awareness of deficits Awareness:  Emergent Problem Solving: Slow processing;Difficulty sequencing;Requires verbal cues;Requires tactile cues General Comments: Decreased safety during mobility; delayed problem solving  Blood pressure 174/71, pulse 68, temperature 98.4 F (36.9 C), temperature source Oral, resp. rate 18, height 5\' 4"  (1.626 m), weight 88.5 kg (195 lb 1.7 oz), SpO2 99.00%. Physical Exam  Vitals reviewed. Constitutional: She is oriented to person, place, and time.  HENT:  Head: Normocephalic.  Eyes: EOM are normal.  Neck: Normal range of motion. Neck supple. No thyromegaly present.  Cardiovascular: Normal rate and regular rhythm.   Respiratory: Effort normal and breath sounds normal. No respiratory distress.  GI: Soft. Bowel sounds are normal. She exhibits no distension.  Neurological: She is alert and oriented to person, place, and time. She has normal strength. She displays no atrophy. No sensory deficit. She exhibits normal muscle tone. Coordination normal.  Follows commands Cerebellar testing normal finger-nose-finger Normal heel to shin No fine motor deficits noted with finger to thumb opposition. No dysdiadochokinesis  Skin: Skin is warm and dry.  Psychiatric: She has a normal mood and affect.    Results for orders placed during the hospital encounter of 07/20/13 (from the past 24 hour(s))  GLUCOSE, CAPILLARY     Status: Abnormal   Collection Time    07/21/13 12:16 PM      Result Value Range   Glucose-Capillary 221 (*) 70 - 99 mg/dL   Comment 1 Notify RN     Comment 2 Documented in Chart    BASIC METABOLIC PANEL     Status: Abnormal   Collection Time    07/21/13  1:30 PM      Result Value Range   Sodium 135  135 - 145 mEq/L   Potassium 4.1  3.5 - 5.1 mEq/L   Chloride 102  96 - 112 mEq/L   CO2 24  19 - 32 mEq/L   Glucose, Bld 262 (*) 70 - 99 mg/dL   BUN 11  6 -  23 mg/dL   Creatinine, Ser 1.30 (*) 0.50 - 1.10 mg/dL   Calcium 8.6  8.4 - 10.5 mg/dL   GFR calc non Af Amer 43 (*) >90 mL/min    GFR calc Af Amer 50 (*) >90 mL/min  GLUCOSE, CAPILLARY     Status: Abnormal   Collection Time    07/21/13  3:34 PM      Result Value Range   Glucose-Capillary 239 (*) 70 - 99 mg/dL   Comment 1 Notify RN     Comment 2 Documented in Chart    GLUCOSE, CAPILLARY     Status: Abnormal   Collection Time    07/21/13  9:31 PM      Result Value Range   Glucose-Capillary 280 (*) 70 - 99 mg/dL   Comment 1 Documented in Chart     Comment 2 Notify RN    GLUCOSE, CAPILLARY     Status: Abnormal   Collection Time    07/22/13  6:49 AM      Result Value Range   Glucose-Capillary 150 (*) 70 - 99 mg/dL   Comment 1 Documented in Chart     Comment 2 Notify RN     Ct Head Wo Contrast  07/20/2013   CLINICAL DATA:  Hypertension with headache  EXAM: CT HEAD WITHOUT CONTRAST  TECHNIQUE: Contiguous axial images were obtained from the base of the skull through the vertex without intravenous contrast. Study was obtained within 24 hr of patient's arrival at the emergency department.  COMPARISON:  None.  FINDINGS: There is mild diffuse atrophy. There is no mass, hemorrhage, extra-axial fluid collection, or midline shift.  There is decreased attenuation in the mid right temporal lobe laterally compared to the left, a finding concerning for recent infarct. No other findings felt to be concerning for acute infarct. There is moderate small vessel disease in the centra semiovale bilaterally.  Bony calvarium appears intact.  The mastoid air cells are clear.  IMPRESSION: Area concerning for recent infarct in the mid right temporal lobe periphery. There is atrophy with periventricular small vessel disease. No hemorrhage or midline shift. Study otherwise unremarkable.   Electronically Signed   By: Lowella Grip M.D.   On: 07/20/2013 17:41   Mr Jodene Nam Head Wo Contrast  07/21/2013   CLINICAL DATA:  Headache.  Abnormal CT.  Rule out stroke.  EXAM: MRI HEAD WITHOUT CONTRAST  MRA HEAD WITHOUT CONTRAST  TECHNIQUE: Multiplanar,  multiecho pulse sequences of the brain and surrounding structures were obtained without intravenous contrast. Angiographic images of the head were obtained using MRA technique without contrast.  COMPARISON:  CT head 07/20/2013  FINDINGS: MRI HEAD FINDINGS  Negative for acute infarct. No lesion in the right temporal lobe as suggested by CT. The CT finding apparently was due to artifact as there was motion on the CT scan. Negative for acute or chronic infarct in the right lateral temporal lobe in the area of concern.  Generalized atrophy. Moderate chronic microvascular ischemic change in the white matter. Brainstem and cerebellum are normal.  Negative for acute infarct.  Negative for hemorrhage or mass.  Normal enhancement following contrast infusion.  MRA HEAD FINDINGS  Both vertebral arteries are patent to the basilar. Posterior inferior cerebellar artery patent bilaterally. The basilar is widely patent. Superior cerebellar and posterior cerebral arteries are patent bilaterally without significant stenosis. Patent posterior communicating artery bilaterally.  Internal carotid artery is widely patent. Mild atherosclerotic irregularity in the cavernous carotid bilaterally without significant stenosis. Anterior and  middle cerebral arteries are patent bilaterally without stenosis.  Negative for cerebral aneurysm.  IMPRESSION: Atrophy and chronic microvascular ischemic change. No acute infarct or mass lesion. No right temporal lobe abnormality as questioned on CT which apparently was motion related artifact.  Negative MRA head.   Electronically Signed   By: Franchot Gallo M.D.   On: 07/21/2013 12:10   Mr Jeri Cos [id number] Contrast  07/21/2013   CLINICAL DATA:  Headache.  Abnormal CT.  Rule out stroke.  EXAM: MRI HEAD WITHOUT CONTRAST  MRA HEAD WITHOUT CONTRAST  TECHNIQUE: Multiplanar, multiecho pulse sequences of the brain and surrounding structures were obtained without intravenous contrast. Angiographic images of the head  were obtained using MRA technique without contrast.  COMPARISON:  CT head 07/20/2013  FINDINGS: MRI HEAD FINDINGS  Negative for acute infarct. No lesion in the right temporal lobe as suggested by CT. The CT finding apparently was due to artifact as there was motion on the CT scan. Negative for acute or chronic infarct in the right lateral temporal lobe in the area of concern.  Generalized atrophy. Moderate chronic microvascular ischemic change in the white matter. Brainstem and cerebellum are normal.  Negative for acute infarct.  Negative for hemorrhage or mass.  Normal enhancement following contrast infusion.  MRA HEAD FINDINGS  Both vertebral arteries are patent to the basilar. Posterior inferior cerebellar artery patent bilaterally. The basilar is widely patent. Superior cerebellar and posterior cerebral arteries are patent bilaterally without significant stenosis. Patent posterior communicating artery bilaterally.  Internal carotid artery is widely patent. Mild atherosclerotic irregularity in the cavernous carotid bilaterally without significant stenosis. Anterior and middle cerebral arteries are patent bilaterally without stenosis.  Negative for cerebral aneurysm.  IMPRESSION: Atrophy and chronic microvascular ischemic change. No acute infarct or mass lesion. No right temporal lobe abnormality as questioned on CT which apparently was motion related artifact.  Negative MRA head.   Electronically Signed   By: Franchot Gallo M.D.   On: 07/21/2013 12:10    Assessment/Plan: Diagnosis: Fall at home on 07/20/2013, workup for stroke negative. Exam is normal. Still with functional deficits. Suspect conversion reaction. Neurology signing off 1. Does the need for close, 24 hr/day medical supervision in concert with the patient's rehab needs make it unreasonable for this patient to be served in a less intensive setting? No 2. Co-Morbidities requiring supervision/potential complications: NA 3. Due to NA, does the  patient require 24 hr/day rehab nursing? No 4. Does the patient require coordinated care of a physician, rehab nurse, NA to address physical and functional deficits in the context of the above medical diagnosis(es)? No Addressing deficits in the following areas: balance and locomotion 5. Can the patient actively participate in an intensive therapy program of at least 3 hrs of therapy per day at least 5 days per week? NA 6. The potential for patient to make measurable gains while on inpatient rehab is NA 7. Anticipated functional outcomes upon discharge from inpatient rehab are NA  with PT, NA with OT, NA with SLP. 8. Estimated rehab length of stay to reach the above functional goals is: NA 9. Does the patient have adequate social supports to accommodate these discharge functional goals? Potentially 10. Anticipated D/C setting: Home 11. Anticipated post D/C treatments: [facility] therapy 12. Overall Rehab/Functional Prognosis: good  RECOMMENDATIONS: This patient's condition is appropriate for continued rehabilitative care in the following setting: [facility] Patient has agreed to participate in recommended program. Potentially Note that insurance prior authorization may be required for  reimbursement for recommended care.  Comment: Pt should respond to ego support and encouragement    07/22/2013

## 2013-07-22 NOTE — Progress Notes (Signed)
Occupational Therapy Treatment Patient Details Name: Kelly Blair MRN: [id number] DOB: 07/04/1951 Today's Date: 07/22/2013 Time: [id number] OT Time Calculation (min): 17 min  OT Assessment / Plan / Recommendation  History of present illness Pt admitted with headache and confusion. Suspect R temporal lobe lession.  MRI negative   OT comments  Pt with significant improvement.  She is now able to perform BADLs with supervision.  She still demonstrates Lt superior field deficit, but testing was inconsistent.  She reports that she does not see an ophthalmologist - reinforced the need for regular eye exams.  Pt does demonstrate mild balance deficits with gait characterized by flexed hips and pt with difficulty regulating speed of ambulation.  She reports this has been present ~ 2 mos and that her friends have been telling her she walks like an old woman.   Follow Up Recommendations  No OT follow up;Supervision - Intermittent    Barriers to Discharge       Equipment Recommendations  None recommended by OT    Recommendations for Other Services    Frequency Min 2X/week   Progress towards OT Goals Progress towards OT goals: Progressing toward goals  Plan Discharge plan needs to be updated    Precautions / Restrictions Precautions Precautions: Fall Precaution Comments: per Dr. Leonie Man pt with L superior field cut Restrictions Weight Bearing Restrictions: No   Pertinent Vitals/Pain     ADL  Eating/Feeding: Independent Where Assessed - Eating/Feeding: Chair Upper Body Bathing: Supervision/safety Where Assessed - Upper Body Bathing: Unsupported standing;Supported standing Lower Body Bathing: Supervision/safety Where Assessed - Lower Body Bathing: Unsupported standing;Supported standing Lower Body Dressing: Supervision/safety Where Assessed - Lower Body Dressing: Unsupported sit to stand Toilet Transfer: Supervision/safety Toilet Transfer Method: Sit to Environmental manager: Comfort height toilet Toileting - Clothing Manipulation and Hygiene: Supervision/safety Where Assessed - Best boy and Hygiene: Standing Tub/Shower Transfer: Supervision/safety Tub/Shower Transfer Method: Ambulating Transfers/Ambulation Related to ADLs: supervision.  Pt tends to ambulate with hips flexed and at times seems to have difficulty controlling speed ADL Comments: Pt able to simulate showering in standing with supervision.  Per confrontation testing, demonstrates field loss lt superior quadrant, but responses are inconsistent.   Pt reports she does not see ophthalmologist.  Emphasized need for regular eye exams and that she should schedule an appt once she discharges home     OT Diagnosis:    OT Problem List:   OT Treatment Interventions:     OT Goals(current goals can now be found in the care plan section) Acute Rehab OT Goals Patient Stated Goal: home Time For Goal Achievement: 08/04/13 Potential to Achieve Goals: Good ADL Goals Pt Will Perform Grooming: with supervision;standing Pt Will Perform Lower Body Bathing: with supervision;sit to/from stand Pt Will Perform Lower Body Dressing: with supervision;sit to/from stand Pt Will Transfer to Toilet: with supervision;ambulating Pt Will Perform Toileting - Clothing Manipulation and hygiene: with supervision;sit to/from stand Additional ADL Goal #1: Demonstrate anticipatory awareness during funcitonal ADL task with min vc  Visit Information  Last OT Received On: 07/22/13 Assistance Needed: +1 History of Present Illness: Pt admitted with headache and confusion. Suspect R temporal lobe lession.    Subjective Data      Prior Functioning       Cognition  Cognition Arousal/Alertness: Awake/alert Behavior During Therapy: WFL for tasks assessed/performed Overall Cognitive Status: Within Functional Limits for tasks assessed    Mobility  Bed Mobility Bed Mobility: Not assessed Details for Bed  Mobility Assistance:  Pt sitting up on EOB upon arrival Transfers Transfers: Sit to Stand;Stand to Sit Sit to Stand: 5: Supervision;With upper extremity assist;From chair/3-in-1 Stand to Sit: 5: Supervision;With upper extremity assist;To chair/3-in-1 Details for Transfer Assistance: cues for hand placement    Exercises      Balance Balance Balance Assessed: Yes Static Standing Balance Static Standing - Balance Support: No upper extremity supported Static Standing - Level of Assistance: 5: Stand by assistance Static Standing - Comment/# of Minutes: Stood with eyes open/closed, feet together/apart with SBA.  360 degree turns with min guard.   Dynamic Standing Balance Dynamic Standing - Balance Support: No upper extremity supported;Left upper extremity supported Dynamic Standing - Level of Assistance: 5: Stand by assistance Dynamic Standing - Comments: simulated shower in standing    End of Session OT - End of Session Activity Tolerance: Patient tolerated treatment well Patient left: in chair;with call bell/phone within reach;with family/visitor present Nurse Communication: Mobility status  GO     Kelly Blair 07/22/2013, 1:37 PM

## 2013-07-22 NOTE — Progress Notes (Signed)
Speech Language Pathology Treatment: Dysphagia;Cognitive-Linquistic  Patient Details Name: Kelly Blair MRN: [id number] DOB: 09/28/1950 Today's Date: 07/22/2013 Time: [id number] SLP Time Calculation (min): 41 min  Assessment / Plan / Recommendation Clinical Impression  Pt demonstrates adequate swallow function, continue regular deit and thin liquids. No f/u needed for swallowing. Pt continues to demonstrate moderately imapired cognition with complex functional tasks. Moderate visual cues needed for sequencing, problem solving and anticipatory awareness. Pt will need f/u cognitive therapy in order to reach functional independence. Recommend CIR.    HPI HPI: 62 y.o. female has a past medical history of Diabetes mellitus; Hypertension; and Hypercholesterolemia.  Presented with intermittent headaches for the past 3 weeks. Today she fell in the bathroom. Once she came to her wok she was confused and had trouble seeing she also had a headache. She has not been taking her insulin and she does not know what she supposed to take for her hypertension. Since patient arrived to ER she has been somewhat somnolent, prior to my evaluation she was also given Ativan 1 mg IV. At this point she is unable to cooperate with exam or give accurate history she does state that she could not see due to sun being in her eyes.    CT scan was done that showed area concerning for recent infarct in the mid right temporal lobe.  MRI pending.    Pertinent Vitals NA  SLP Plan  Continue with current plan of care    Recommendations Diet recommendations: Regular;Thin liquid Liquids provided via: Cup;Straw Medication Administration: Whole meds with liquid Supervision: Patient able to self feed Postural Changes and/or Swallow Maneuvers: Seated upright 90 degrees              General recommendations: Rehab consult Oral Care Recommendations: Oral care BID Follow up Recommendations: Inpatient Rehab Plan: Continue with current  plan of care    GO    [facility], MA CCC-SLP [id number]  Kelly Blair 07/22/2013, 10:47 AM

## 2013-07-22 NOTE — Discharge Summary (Signed)
Physician Discharge Summary  Kelly Blair [id number] DOB: 16-Aug-1950 DOA: 07/20/2013  PCP: Elyn Peers, MD  Admit date: 07/20/2013 Discharge date: 07/22/2013  Time spent: 35 minutes  Recommendations for Outpatient Follow-up:  Check blood sugars at home and bring to PCP Outpatient PT Monitor BP closely- titrate meds as needed BMp 1 week        FLP, LFTs 6 weeks  Discharge Diagnoses:  Principal Problem:   TIA (transient ischemic attack) Active Problems:   Hypertension   Diabetes mellitus due to underlying condition with mild nonproliferative diabetic retinopathy without macular edema   Other and unspecified hyperlipidemia   Morbid obesity   Non-adherence to medical treatment   Discharge Condition: improved  Diet recommendation: cardiac/diabetic  Filed Weights   07/20/13 2250  Weight: 88.5 kg (195 lb 1.7 oz)    History of present illness:  Kelly Blair is a 62 y.o. female  has a past medical history of Diabetes mellitus; Hypertension; and Hypercholesterolemia.  Presented with  Patient have had intermittent headaches for the past 3 weeks. Today she fell in the bathroom. Once she came to her wok she was confused and had trouble seeing she also had a headache. She has not been taking her insulin and she does not know what she supposed to take for her hypertension. Since patient arrived to ER she has been somewhat somnolent, prior to my evaluation she was also given Ativan 1 mg IV. At this point she is unable to cooperate with exam or give accurate history she does state that she could not see due to sun being in her eyes. Family at bedside.  Neurology have seen patient. CT scan was done that showed Right mid temporal lobe stroke.  Hospitalist called for admission. Of note patient have had severely elevated BP up to 215.   Hospital Course:  TIA (transient ischemic attack)- MRI negative for CVA but has atrophy and chronic changes  - cont baby ASA- discussed strict  control of her major medical problems to prevent a CVA  -could be complicated migraine  Hypertension  - cont Cozaar and increase Norvasc   CKD 3  - baseline uncertain but pt was told she has kidney problems  Appears stable   Diabetes mellitus due to underlying condition with mild nonproliferative diabetic retinopathy without macular edema  - Uncontrolled - HbA1c- 11.5  - cont Lantus and follow sugars   Other and unspecified hyperlipidemia  - start Crestor   Morbid obesity  - encourage weight loss  Non-adherence to medical treatment -encouraged compliance    Consultations:  neuro  Discharge Exam: Filed Vitals:   07/22/13 1407  BP: 198/96  Pulse: 71  Temp: 97.9 F (36.6 C)  Resp: 18     Discharge Instructions  Discharge Orders   Future Orders Complete By Expires   Ambulatory referral to Nutrition and Diabetic Education  As directed    Comments:     PCP Dr. Lucianne Lei.  A1c 11.5%.    Patient: Please call the [facility] after discharge to schedule an appointment for diabetes education if you do not hear from the center before discharge  [phone]   Diet - low sodium heart healthy  As directed    Diet Carb Modified  As directed    Discharge instructions  As directed    Comments:     Check blood sugars at home and bring to PCP Outpatient PT Monitor BP closely BMp 1 week FLP, LFTs  6 weeks   Increase activity slowly  As directed        Medication List    STOP taking these medications       naproxen sodium 220 MG tablet  Commonly known as:  ANAPROX      TAKE these medications       amLODipine 10 MG tablet  Commonly known as:  NORVASC  Take 1 tablet (10 mg total) by mouth daily.  Start taking on:  07/23/2013     aspirin 81 MG chewable tablet  Chew 1 tablet (81 mg total) by mouth daily.     atorvastatin 10 MG tablet  Commonly known as:  LIPITOR  Take 1 tablet (10 mg total) by mouth daily at 6 PM.      Canagliflozin 300 MG Tabs  Take 300 mg by mouth every morning.     HYDROcodone-acetaminophen 5-325 MG per tablet  Commonly known as:  NORCO/VICODIN  Take 1-2 tablets by mouth every 4 (four) hours as needed for moderate pain.     insulin glargine 100 UNIT/ML injection  Commonly known as:  LANTUS  Inject 60 Units into the skin at bedtime.     losartan 100 MG tablet  Commonly known as:  COZAAR  Take 1 tablet (100 mg total) by mouth daily.  Start taking on:  07/23/2013     sitaGLIPtin 100 MG tablet  Commonly known as:  JANUVIA  Take 100 mg by mouth daily.       No Known Allergies    The results of significant diagnostics from this hospitalization (including imaging, microbiology, ancillary and laboratory) are listed below for reference.    Significant Diagnostic Studies: Ct Head Wo Contrast  07/20/2013   CLINICAL DATA:  Hypertension with headache  EXAM: CT HEAD WITHOUT CONTRAST  TECHNIQUE: Contiguous axial images were obtained from the base of the skull through the vertex without intravenous contrast. Study was obtained within 24 hr of patient's arrival at the emergency department.  COMPARISON:  None.  FINDINGS: There is mild diffuse atrophy. There is no mass, hemorrhage, extra-axial fluid collection, or midline shift.  There is decreased attenuation in the mid right temporal lobe laterally compared to the left, a finding concerning for recent infarct. No other findings felt to be concerning for acute infarct. There is moderate small vessel disease in the centra semiovale bilaterally.  Bony calvarium appears intact.  The mastoid air cells are clear.  IMPRESSION: Area concerning for recent infarct in the mid right temporal lobe periphery. There is atrophy with periventricular small vessel disease. No hemorrhage or midline shift. Study otherwise unremarkable.   Electronically Signed   By: Lowella Grip M.D.   On: 07/20/2013 17:41   Mr Jodene Nam Head Wo Contrast  07/21/2013   CLINICAL  DATA:  Headache.  Abnormal CT.  Rule out stroke.  EXAM: MRI HEAD WITHOUT CONTRAST  MRA HEAD WITHOUT CONTRAST  TECHNIQUE: Multiplanar, multiecho pulse sequences of the brain and surrounding structures were obtained without intravenous contrast. Angiographic images of the head were obtained using MRA technique without contrast.  COMPARISON:  CT head 07/20/2013  FINDINGS: MRI HEAD FINDINGS  Negative for acute infarct. No lesion in the right temporal lobe as suggested by CT. The CT finding apparently was due to artifact as there was motion on the CT scan. Negative for acute or chronic infarct in the right lateral temporal lobe in the area of concern.  Generalized atrophy. Moderate chronic microvascular ischemic change in the white matter. Brainstem and  cerebellum are normal.  Negative for acute infarct.  Negative for hemorrhage or mass.  Normal enhancement following contrast infusion.  MRA HEAD FINDINGS  Both vertebral arteries are patent to the basilar. Posterior inferior cerebellar artery patent bilaterally. The basilar is widely patent. Superior cerebellar and posterior cerebral arteries are patent bilaterally without significant stenosis. Patent posterior communicating artery bilaterally.  Internal carotid artery is widely patent. Mild atherosclerotic irregularity in the cavernous carotid bilaterally without significant stenosis. Anterior and middle cerebral arteries are patent bilaterally without stenosis.  Negative for cerebral aneurysm.  IMPRESSION: Atrophy and chronic microvascular ischemic change. No acute infarct or mass lesion. No right temporal lobe abnormality as questioned on CT which apparently was motion related artifact.  Negative MRA head.   Electronically Signed   By: Franchot Gallo M.D.   On: 07/21/2013 12:10   Mr Jeri Cos [id number] Contrast  07/21/2013   CLINICAL DATA:  Headache.  Abnormal CT.  Rule out stroke.  EXAM: MRI HEAD WITHOUT CONTRAST  MRA HEAD WITHOUT CONTRAST  TECHNIQUE: Multiplanar,  multiecho pulse sequences of the brain and surrounding structures were obtained without intravenous contrast. Angiographic images of the head were obtained using MRA technique without contrast.  COMPARISON:  CT head 07/20/2013  FINDINGS: MRI HEAD FINDINGS  Negative for acute infarct. No lesion in the right temporal lobe as suggested by CT. The CT finding apparently was due to artifact as there was motion on the CT scan. Negative for acute or chronic infarct in the right lateral temporal lobe in the area of concern.  Generalized atrophy. Moderate chronic microvascular ischemic change in the white matter. Brainstem and cerebellum are normal.  Negative for acute infarct.  Negative for hemorrhage or mass.  Normal enhancement following contrast infusion.  MRA HEAD FINDINGS  Both vertebral arteries are patent to the basilar. Posterior inferior cerebellar artery patent bilaterally. The basilar is widely patent. Superior cerebellar and posterior cerebral arteries are patent bilaterally without significant stenosis. Patent posterior communicating artery bilaterally.  Internal carotid artery is widely patent. Mild atherosclerotic irregularity in the cavernous carotid bilaterally without significant stenosis. Anterior and middle cerebral arteries are patent bilaterally without stenosis.  Negative for cerebral aneurysm.  IMPRESSION: Atrophy and chronic microvascular ischemic change. No acute infarct or mass lesion. No right temporal lobe abnormality as questioned on CT which apparently was motion related artifact.  Negative MRA head.   Electronically Signed   By: Franchot Gallo M.D.   On: 07/21/2013 12:10    Microbiology: Recent Results (from the past 240 hour(s))  MRSA PCR SCREENING     Status: None   Collection Time    07/21/13  6:22 AM      Result Value Range Status   MRSA by PCR NEGATIVE  NEGATIVE Final   Comment:            The GeneXpert MRSA Assay (FDA     approved for NASAL specimens     only), is one  component of a     comprehensive MRSA colonization     surveillance program. It is not     intended to diagnose MRSA     infection nor to guide or     monitor treatment for     MRSA infections.     Labs: Basic Metabolic Panel:  Recent Labs Lab 07/20/13 1610 07/21/13 1330  NA 134* 135  K 3.7 4.1  CL 96 102  CO2 25 24  GLUCOSE 513* 262*  BUN 12 11  CREATININE 1.31*  1.30*  CALCIUM 9.4 8.6   Liver Function Tests:  Recent Labs Lab 07/20/13 1610  AST 15  ALT 12  ALKPHOS 173*  BILITOT 0.2*  PROT 7.8  ALBUMIN 3.5   No results found for this basename: LIPASE, AMYLASE,  in the last 168 hours No results found for this basename: AMMONIA,  in the last 168 hours CBC:  Recent Labs Lab 07/20/13 1610  WBC 10.4  HGB 13.3  HCT 39.8  MCV 83.1  PLT 257   Cardiac Enzymes: No results found for this basename: CKTOTAL, CKMB, CKMBINDEX, TROPONINI,  in the last 168 hours BNP: BNP (last 3 results) No results found for this basename: PROBNP,  in the last 8760 hours CBG:  Recent Labs Lab 07/21/13 0730 07/21/13 1216 07/21/13 1534 07/21/13 2131 07/22/13 0649  GLUCAP 167* 221* 239* 280* 150*       Signed:  Hanad Leino  [facility] 07/22/2013, 2:41 PM

## 2013-07-22 NOTE — Progress Notes (Signed)
Inpatient Diabetes Program Recommendations  AACE/ADA: New Consensus Statement on Inpatient Glycemic Control (2013)  Target Ranges:  Prepandial:   less than 140 mg/dL      Peak postprandial:   less than 180 mg/dL (1-2 hours)      Critically ill patients:  140 - 180 mg/dL     Results for Kelly Blair, Kelly Blair (MRN [id number]) as of 07/22/2013 15:08  Ref. Range 07/21/2013 05:30  Hemoglobin A1C Latest Range: <5.7 % 11.5 (H)    **Patient admitted with CVA.  Plans to go to Inpatient Rehab for further therapy.  **Spoke to patient about her current A1c of 11.5% (07/21/13).  Explained what an A1c is and what it measures.  Reminded patient that her goal A1c is 7% or less per ADA standards to prevent both acute and long-term complications.  Patient told me she has been under a lot of stress (she works part-time and only earns $10 per hour).  Patient stated she has lots of bills and could not afford her Lantus insulin pen needles.  Patient also stated she ran out of [place] and did not follow up with her PCP Dr. Criss Rosales for Rx refills.  Patient stated this is likely the reason why her A1c is so elevated this admission.  Encouraged patient to make a follow up appointment with Dr. Criss Rosales after d/c so she can continue to care for her DM management.   **MD- Patient will need Rxs for Januvia (sitagliptin) and Invokana (canagliflozin) at d/c.  Please also provide patient with Rx for insulin pen needles at d/c.  Thanks!    Will follow. Wyn Quaker RN, MSN, CDE Diabetes Coordinator Inpatient Diabetes Program Team Pager: [phone] (8a-10p)

## 2013-07-22 NOTE — Progress Notes (Signed)
Agree with PTA.    Keilany Burnette, PT [phone]  

## 2013-07-22 NOTE — Progress Notes (Addendum)
Physical Therapy Treatment Patient Details Name: Kelly Blair MRN: [id number] DOB: 27-Mar-1951 Today's Date: 07/22/2013 Time: [id number] PT Time Calculation (min): 19 min  PT Assessment / Plan / Recommendation  History of Present Illness Pt admitted with headache and confusion. Suspect R temporal lobe lession.   PT Comments   Pt making great progress with mobility today.  Increased ambulation distance as well as more independent with all mobility.  Pt does still present with some balance deficits as she began to demonstrate anterior bias while walking as distance increased.  Cont with current POC to maximize functional mobility.  Due to great progress this clinician feels pt would benefit from OPPT at d/c.  D/c recommendations updated.     Follow Up Recommendations  Supervision/Assistance - 24 hour; Outpatient PT.       Does the patient have the potential to tolerate intense rehabilitation     Barriers to Discharge        Equipment Recommendations  Other (comment) (TBD)    Recommendations for Other Services    Frequency Min 4X/week   Progress towards PT Goals Progress towards PT goals: Progressing toward goals  Plan Current plan remains appropriate    Precautions / Restrictions Precautions Precautions: Fall Precaution Comments: per Dr. Leonie Man pt with L superior field cut Restrictions Weight Bearing Restrictions: No   Pertinent Vitals/Pain C/o Rt side of face pain.  Did not rate.      Mobility  Bed Mobility Bed Mobility: Not assessed Details for Bed Mobility Assistance: Pt sitting up on EOB upon arrival Transfers Transfers: Sit to Stand;Stand to Sit Sit to Stand: 4: Min guard;With upper extremity assist;From bed Stand to Sit: 4: Min guard;With upper extremity assist;With armrests;To chair/3-in-1 Details for Transfer Assistance: cues for hand placement Ambulation/Gait Ambulation/Gait Assistance: 4: Min guard;4: Min assist Ambulation Distance (Feet): 300 Feet Assistive  device: None Ambulation/Gait Assistance Details: Min guard initially but then required min (A) for balance as distance increased.  Pt began to shift trunk more anteriorly over BOS requiring cues to correct.   Gait Pattern: Step-through pattern;Decreased stride length;Wide base of support Gait velocity: decreased Stairs: No Wheelchair Mobility Wheelchair Mobility: No Modified Rankin (Stroke Patients Only) Pre-Morbid Rankin Score: No symptoms Modified Rankin: Moderately severe disability     PT Goals (current goals can now be found in the care plan section) Acute Rehab PT Goals Patient Stated Goal: home PT Goal Formulation: With patient Time For Goal Achievement: 07/28/13 Potential to Achieve Goals: Good  Visit Information  Last PT Received On: 07/22/13 Assistance Needed: +1 History of Present Illness: Pt admitted with headache and confusion. Suspect R temporal lobe lession.    Subjective Data  Patient Stated Goal: home   Cognition  Cognition Arousal/Alertness: Awake/alert Behavior During Therapy: WFL for tasks assessed/performed    Balance  Static Standing Balance Static Standing - Balance Support: No upper extremity supported Static Standing - Level of Assistance: 5: Stand by assistance Static Standing - Comment/# of Minutes: Stood with eyes open/closed, feet together/apart with SBA.  360 degree turns with min guard.    End of Session PT - End of Session Equipment Utilized During Treatment: Gait belt Activity Tolerance: Patient tolerated treatment well Patient left: in chair;with call bell/phone within reach Nurse Communication: Mobility status   GP     Sena Hitch 07/22/2013, 1:10 PM   Sarajane Marek, PTA [phone] 07/22/2013

## 2013-07-22 NOTE — Progress Notes (Signed)
Discharge instructions given. Pt verbalized understanding

## 2013-08-03 ENCOUNTER — Ambulatory Visit: Payer: BC Managed Care – PPO | Attending: Family Medicine | Admitting: Occupational Therapy

## 2013-08-03 ENCOUNTER — Ambulatory Visit: Payer: BC Managed Care – PPO

## 2013-08-03 DIAGNOSIS — Z5189 Encounter for other specified aftercare: Secondary | ICD-10-CM | POA: Insufficient documentation

## 2013-08-03 DIAGNOSIS — M6281 Muscle weakness (generalized): Secondary | ICD-10-CM | POA: Insufficient documentation

## 2013-08-03 DIAGNOSIS — R279 Unspecified lack of coordination: Secondary | ICD-10-CM | POA: Insufficient documentation

## 2013-08-03 DIAGNOSIS — Z8673 Personal history of transient ischemic attack (TIA), and cerebral infarction without residual deficits: Secondary | ICD-10-CM | POA: Insufficient documentation

## 2013-08-05 ENCOUNTER — Ambulatory Visit: Payer: BC Managed Care – PPO

## 2013-08-11 ENCOUNTER — Ambulatory Visit: Payer: BC Managed Care – PPO | Attending: Family Medicine

## 2013-08-11 ENCOUNTER — Ambulatory Visit: Payer: BC Managed Care – PPO | Admitting: Occupational Therapy

## 2013-08-11 DIAGNOSIS — M6281 Muscle weakness (generalized): Secondary | ICD-10-CM | POA: Insufficient documentation

## 2013-08-11 DIAGNOSIS — Z5189 Encounter for other specified aftercare: Secondary | ICD-10-CM | POA: Diagnosis present

## 2013-08-11 DIAGNOSIS — Z8673 Personal history of transient ischemic attack (TIA), and cerebral infarction without residual deficits: Secondary | ICD-10-CM | POA: Diagnosis not present

## 2013-08-11 DIAGNOSIS — R279 Unspecified lack of coordination: Secondary | ICD-10-CM | POA: Diagnosis not present

## 2013-08-12 ENCOUNTER — Ambulatory Visit: Payer: BC Managed Care – PPO

## 2013-08-12 ENCOUNTER — Ambulatory Visit: Payer: BC Managed Care – PPO | Admitting: Occupational Therapy

## 2013-08-12 DIAGNOSIS — Z5189 Encounter for other specified aftercare: Secondary | ICD-10-CM | POA: Diagnosis not present

## 2013-08-17 ENCOUNTER — Ambulatory Visit: Payer: BC Managed Care – PPO | Admitting: Physical Therapy

## 2013-08-17 ENCOUNTER — Ambulatory Visit: Payer: BC Managed Care – PPO | Admitting: Occupational Therapy

## 2013-08-17 DIAGNOSIS — Z5189 Encounter for other specified aftercare: Secondary | ICD-10-CM | POA: Diagnosis not present

## 2013-08-20 ENCOUNTER — Ambulatory Visit: Payer: BC Managed Care – PPO | Admitting: Physical Therapy

## 2013-08-20 ENCOUNTER — Ambulatory Visit: Payer: BC Managed Care – PPO | Admitting: Occupational Therapy

## 2013-08-24 ENCOUNTER — Ambulatory Visit: Payer: BC Managed Care – PPO | Admitting: Physical Therapy

## 2013-08-24 ENCOUNTER — Ambulatory Visit: Payer: BC Managed Care – PPO | Admitting: Occupational Therapy

## 2013-08-24 DIAGNOSIS — Z5189 Encounter for other specified aftercare: Secondary | ICD-10-CM | POA: Diagnosis not present

## 2013-08-28 ENCOUNTER — Ambulatory Visit: Payer: BC Managed Care – PPO | Admitting: Occupational Therapy

## 2013-08-28 ENCOUNTER — Ambulatory Visit: Payer: BC Managed Care – PPO | Admitting: Physical Therapy

## 2013-08-28 DIAGNOSIS — Z5189 Encounter for other specified aftercare: Secondary | ICD-10-CM | POA: Diagnosis not present

## 2013-08-31 ENCOUNTER — Encounter: Payer: BC Managed Care – PPO | Admitting: Occupational Therapy

## 2013-08-31 ENCOUNTER — Ambulatory Visit: Payer: BC Managed Care – PPO | Admitting: Physical Therapy

## 2013-09-01 ENCOUNTER — Ambulatory Visit: Payer: BC Managed Care – PPO | Admitting: Physical Therapy

## 2013-09-01 ENCOUNTER — Ambulatory Visit: Payer: BC Managed Care – PPO | Admitting: Occupational Therapy

## 2013-09-01 DIAGNOSIS — Z5189 Encounter for other specified aftercare: Secondary | ICD-10-CM | POA: Diagnosis not present

## 2013-09-02 ENCOUNTER — Ambulatory Visit: Payer: BC Managed Care – PPO | Admitting: Occupational Therapy

## 2013-09-02 ENCOUNTER — Ambulatory Visit: Payer: BC Managed Care – PPO | Admitting: Physical Therapy

## 2013-09-02 DIAGNOSIS — Z5189 Encounter for other specified aftercare: Secondary | ICD-10-CM | POA: Diagnosis not present

## 2013-09-03 ENCOUNTER — Encounter: Payer: BC Managed Care – PPO | Admitting: Occupational Therapy

## 2013-09-03 ENCOUNTER — Ambulatory Visit: Payer: BC Managed Care – PPO | Admitting: Physical Therapy

## 2013-09-08 ENCOUNTER — Encounter: Payer: BC Managed Care – PPO | Admitting: Occupational Therapy

## 2013-09-08 ENCOUNTER — Ambulatory Visit: Payer: BC Managed Care – PPO | Attending: Family Medicine | Admitting: Physical Therapy

## 2013-09-08 DIAGNOSIS — M6281 Muscle weakness (generalized): Secondary | ICD-10-CM | POA: Insufficient documentation

## 2013-09-08 DIAGNOSIS — Z8673 Personal history of transient ischemic attack (TIA), and cerebral infarction without residual deficits: Secondary | ICD-10-CM | POA: Insufficient documentation

## 2013-09-08 DIAGNOSIS — Z5189 Encounter for other specified aftercare: Secondary | ICD-10-CM | POA: Insufficient documentation

## 2013-09-08 DIAGNOSIS — R279 Unspecified lack of coordination: Secondary | ICD-10-CM | POA: Insufficient documentation

## 2013-09-10 ENCOUNTER — Encounter: Payer: BC Managed Care – PPO | Attending: Internal Medicine | Admitting: *Deleted

## 2013-09-10 ENCOUNTER — Encounter: Payer: Self-pay | Admitting: *Deleted

## 2013-09-10 DIAGNOSIS — E083299 Diabetes mellitus due to underlying condition with mild nonproliferative diabetic retinopathy without macular edema, unspecified eye: Secondary | ICD-10-CM

## 2013-09-10 DIAGNOSIS — Z713 Dietary counseling and surveillance: Secondary | ICD-10-CM | POA: Insufficient documentation

## 2013-09-10 DIAGNOSIS — E119 Type 2 diabetes mellitus without complications: Secondary | ICD-10-CM | POA: Insufficient documentation

## 2013-09-10 DIAGNOSIS — Z794 Long term (current) use of insulin: Secondary | ICD-10-CM | POA: Insufficient documentation

## 2013-09-10 NOTE — Progress Notes (Signed)
Appt start time: 0800 end time:  0930.  Assessment:  Patient was seen on  09/10/13 for individual diabetes education. She lives with her cousin who is disabled. Patient does most of the grocery shopping and cooking of the meals. Works with Clinical [employer], but has a long commute from [employer] to [employer] which is stressful. She was recently hospitalized with TIA in December, 2014. She had previously dropped off on her diabetes management, not going to MD for regular check ups and not checking her BG anymore. Since her TIA she is seeing her MD as directed, is SMBG 3-4 times a day and is now wearing the V-Go insulin delivery device. Before she started the V-Go she reports that she had some low BGs, but none since on the V-Go.   Current HbA1c: 11.5% on 07/21/14  Preferred Learning Style:   Hands on   Learning Readiness:   Ready  Change in progress  MEDICATIONS: see list. Diabetes medications are Humalog insulin in V-Go and now on Invokana  DIETARY INTAKE:  24-hr recall:  B ( AM): used to skip, now typically has unsweetened cereal with 2% milk OR regular oatmeal with nuts and apples OR egg with bacon OR cheese toast x 2, coffee with creamer and 2 tsp sugar Snk ( AM): occasionally fresh fruit  L ( PM): hot meal of meat, starch, vegetable and water or iced unsweetened tea with Splenda Snk ( PM): occasionally fresh fruit or nuts D ( PM): left overs OR sandwich with lean processed meat and cheese, fresh or canned fruit, water or iced unsweetened tea with Splenda  Snk ( PM): occasionally if early dinner, 3-4 PNB crackers OR cereal with milk OR [place] with cheese Beverages: coffee, water or iced unsweetened tea with Splenda or hot tea  Usual physical activity: physical therapy post TIA and some walking  Estimated energy needs: 1200 calories 135 g carbohydrates 90 g protein 33 g fat   Intervention:  Nutrition counseling provided.  Discussed diabetes disease process and  treatment options.  Discussed physiology of diabetes and role of obesity on insulin resistance.  Encouraged moderate weight reduction to improve glucose levels.  Discussed role of medications and diet in glucose control  Provided education on macronutrients on glucose levels.  Provided education on carb counting, importance of regularly scheduled meals/snacks, and meal planning  Discussed effects of physical activity on glucose levels and long-term glucose control.  Recommended she continue with her current exercise plan and increase as tolerated.  Reviewed patient medications.  Discussed role of medication on blood glucose and possible side effects  Discussed blood glucose monitoring and interpretation.  Discussed recommended target ranges and individual ranges.    Described short-term complications: hyper- and hypo-glycemia.  Discussed causes,symptoms, and treatment options.  Discussed prevention, detection, and treatment of long-term complications.  Discussed the role of prolonged elevated glucose levels on body systems.  Provided information on:  Role of stress on blood glucose levels and discussed strategies to manage psychosocial issues.  Recommendations for long-term diabetes self-care.  Established checklist for medical, dental, and emotional self-care.  Plan:  Aim for 2 Carb Choices per meal (30 grams) +/- 1 either way  Aim for 0-1 Carbs per snack if hungry  Include protein with meals and snacks in moderation Consider reading food labels for Total Carbohydrate of foods Continue  iwith your activity level daily as tolerated Continue checking BG at alternate times per day    Teaching Method Utilized: Visual, Auditory and Hands on  Handouts given during visit include: Living Well with Diabetes Carb Counting and Food Label handouts Meal Plan Card  Meal Planning Sheet  Barriers to learning/adherence to lifestyle change: recovering from TIA, depending on others to drive to  appointments, etc  Diabetes self-care support plan:   [facility] support group  Demonstrated degree of understanding via:  Teach Back   Monitoring/Evaluation:  Dietary intake, exercise, reading food labels, and body weight prn.

## 2013-09-10 NOTE — Patient Instructions (Signed)
Plan:  Aim for 2 Carb Choices per meal (30 grams) +/- 1 either way  Aim for 0-1 Carbs per snack if hungry  Include protein with meals and snacks in moderation Consider reading food labels for Total Carbohydrate of foods Continue  iwith your activity level daily as tolerated Continue checking BG at alternate times per day

## 2013-09-11 ENCOUNTER — Encounter: Payer: BC Managed Care – PPO | Admitting: Occupational Therapy

## 2013-09-11 ENCOUNTER — Ambulatory Visit: Payer: BC Managed Care – PPO | Admitting: Physical Therapy

## 2013-09-14 ENCOUNTER — Encounter: Payer: BC Managed Care – PPO | Admitting: Occupational Therapy

## 2013-09-14 ENCOUNTER — Ambulatory Visit: Payer: BC Managed Care – PPO | Admitting: Physical Therapy

## 2013-09-16 ENCOUNTER — Ambulatory Visit: Payer: BC Managed Care – PPO

## 2013-09-16 ENCOUNTER — Encounter: Payer: BC Managed Care – PPO | Admitting: Occupational Therapy

## 2013-09-21 ENCOUNTER — Ambulatory Visit: Payer: BC Managed Care – PPO | Admitting: Physical Therapy

## 2013-09-21 ENCOUNTER — Encounter: Payer: BC Managed Care – PPO | Admitting: Occupational Therapy

## 2013-09-24 ENCOUNTER — Ambulatory Visit: Payer: BC Managed Care – PPO | Admitting: Physical Therapy

## 2013-09-24 ENCOUNTER — Encounter: Payer: BC Managed Care – PPO | Admitting: Occupational Therapy

## 2013-09-28 ENCOUNTER — Ambulatory Visit: Payer: BC Managed Care – PPO | Admitting: Physical Therapy

## 2013-09-28 ENCOUNTER — Encounter: Payer: BC Managed Care – PPO | Admitting: Occupational Therapy

## 2013-10-02 ENCOUNTER — Ambulatory Visit: Payer: BC Managed Care – PPO | Admitting: Physical Therapy

## 2013-10-02 ENCOUNTER — Encounter: Payer: BC Managed Care – PPO | Admitting: Occupational Therapy

## 2015-01-05 ENCOUNTER — Ambulatory Visit (HOSPITAL_COMMUNITY)
Admission: RE | Admit: 2015-01-05 | Discharge: 2015-01-05 | Disposition: A | Discharge status: Home / Self Care | Payer: 59 | Source: Ambulatory Visit | Attending: Family Medicine | Admitting: Family Medicine

## 2015-01-05 ENCOUNTER — Other Ambulatory Visit (HOSPITAL_COMMUNITY): Payer: Self-pay | Admitting: Family Medicine

## 2015-01-05 DIAGNOSIS — M25521 Pain in right elbow: Secondary | ICD-10-CM | POA: Insufficient documentation

## 2015-01-05 DIAGNOSIS — M25561 Pain in right knee: Secondary | ICD-10-CM | POA: Insufficient documentation

## 2015-01-05 DIAGNOSIS — M25571 Pain in right ankle and joints of right foot: Secondary | ICD-10-CM | POA: Insufficient documentation

## 2015-01-05 DIAGNOSIS — M25572 Pain in left ankle and joints of left foot: Secondary | ICD-10-CM | POA: Insufficient documentation

## 2015-01-05 DIAGNOSIS — M25562 Pain in left knee: Secondary | ICD-10-CM | POA: Diagnosis not present

## 2015-01-05 DIAGNOSIS — M25522 Pain in left elbow: Secondary | ICD-10-CM | POA: Insufficient documentation

## 2015-01-05 DIAGNOSIS — M130 Polyarthritis, unspecified: Secondary | ICD-10-CM | POA: Diagnosis not present

## 2015-11-04 LAB — GLUCOSE, POCT (MANUAL RESULT ENTRY): POC GLUCOSE: 142 mg/dL — AB (ref 70–99)

## 2016-04-02 ENCOUNTER — Ambulatory Visit: Payer: BLUE CROSS/BLUE SHIELD | Attending: Psychology | Admitting: Psychology

## 2016-04-02 DIAGNOSIS — F341 Dysthymic disorder: Secondary | ICD-10-CM

## 2016-04-02 DIAGNOSIS — R413 Other amnesia: Secondary | ICD-10-CM | POA: Diagnosis not present

## 2016-04-03 NOTE — Progress Notes (Signed)
[facility]  [address]   Telephone [phone] [address] Fax [phone] [place], [place] [address]  Initial Contact Note  Name:  Kelly Blair Date of Birth; Apr 05, 1951 MRN:  [id number] Date:  04/03/2016  Kelly Blair is an 65 y.o. female who was referred for neuropsychological evaluation by Lucianne Lei, MD due to problems with memory.   A total of 4 hours was spent today reviewing medical records, interviewing (CPT [phone]) Kelly Blair and administering and scoring neurocognitive tests (CPT [id number] & [phone]).  Diagnostic Impressions: Complaints of memory disturbance [R41.3] Persistent depressive disorder (dysthymia)[F34.1]   There were no concerns expressed or behaviors displayed by Kelly Bellman Chavarria that would require immediate attention.   A full report will follow once the results have been discussed with wehr. Her next appointment is scheduled for 04/05/16.   Jamey Ripa, Ph.D Licensed Psychologist 04/03/2016

## 2016-04-05 ENCOUNTER — Encounter: Payer: Self-pay | Admitting: Psychology

## 2016-04-05 ENCOUNTER — Ambulatory Visit (INDEPENDENT_AMBULATORY_CARE_PROVIDER_SITE_OTHER): Payer: BLUE CROSS/BLUE SHIELD | Admitting: Psychology

## 2016-04-05 DIAGNOSIS — R413 Other amnesia: Secondary | ICD-10-CM

## 2016-04-05 DIAGNOSIS — F341 Dysthymic disorder: Secondary | ICD-10-CM

## 2016-04-05 NOTE — Progress Notes (Signed)
[facility]  [address]   Telephone [phone] [address] Fax [phone] [place], [place] [address]    [place]* This report should not be released without the consent of the client  Name:   Kelly Blair    Date of Birth:  2051-03-16 [facility] MR#:  [id number] Date of Evaluation: 04/02/16  Reason for Referral Anathea Pinera is a 65 year-old right-handed woman referred for neuropsychological evaluation by Lucianne Lei, MD as prompted by Ms. Carns's concerns about her memory.   Sources of Information Medical records from Dr. Criss Rosales and electronic medical records from the [facility] were reviewed. Ms. Crisafulli was interviewed.   Chief Complaints Ms. Speelman reported that over the past year she has had more difficulty with word finding while speaking and has been more forgetful. Her cousin, who lives with her, has told her that she has been forgetful of late. She was not aware of having any problems performing her basic and instrumental activities of living.  She was not aware of any major changes in her health, medication usage, mood, level of life stress or social situation within the past two years.   Her other major concern was her low mood and level of life satisfaction. She reported that she has felt depressed for the past ten to fifteen years though never to a disabling degree or to the point of suicidal intent. Within the past year she has been feeling increasingly irritable, which she usually has been able to conceal from others. She has felt resentful of the increased demands made on her by a cousin. She described her lifestyle as usually "boring" and reported that she engages in few activities that give her enjoyment. She used to enjoy gambling at a casino but stopped going about three years ago after some family members expressed disapproval on religious grounds. She has not been reading as much lately. She  stated "I have never been an exciting person" and reported having a relatively narrow range of interests. She has been spending most of her time alone, providing care to her sister-in-law with dementia or going to church. She has rarely socialized with friends over the past few years, which she attributed to being passive about contacting others and her lack of financial resources to do social things. She has not had or sought a romantic relationship since she was in college. She denied experiencing persisting sad mood, apathy, mood lability, anxiety, suicidal or homicidal ideation, delusions or hallucinations.   Other complaints included mild knee and lower leg pain, reduced distance vision and increased daytime fatigue over the past three months. She denied problems with eye-hand coordination, balance, gait, limb strength, sleep, hearing, speech, appetite, swallowing, smell or taste.   Background She has been living with her cousin in her cousin's home for the past ten years. Prior to that she lived with her parents. She has never married nor had children.  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 She had previously worked as a Emergency planning/management officer for a Engineer, civil (consulting).   She reported that she earned a Bachelor's degree in Psychology and later took some courses towards a graduate degree in Aeronautical engineer and Driver's Education". 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 She reported no history of attentional or learning difficulties.   Her past medical history was notable  for arthritis, diabetes mellitus with mild nonproliferative diabetic retinopathy without macular edema, hyperlipidemia and hypertension. She suffered a suspected TIA in December 2014. She reported difficulties with balance and memory for about two months afterward followed by a  return to baseline.   She reported no history of head injury, loss of consciousness, seizure activity, exposure to toxic chemicals or substance abuse.   With regards to family history, she reported that her mother developed dementia in her eighties. She was not aware of any history of psychiatric disorder in her family.  She reported no history of serious or disabling emotional difficulties, including severe depression, mania, suicidal behavior or psychosis. She reported having seen two psychotherapists for one or two sessions each about five years ago. She stated that she did not find those sessions helpful. She has never taken psychiatric medication.  Her current medications include amlodipine, clonidine, doxazosin, Indapamide, insulin, rosuvastatin, carvedilol and metformin.   Observations She appeared as an appropriately dressed and groomed woman in no apparent distress. She interacted in a pleasant and cooperative manner. She spoke in a normal tone of voice, maintained good eye contact and responded to all questions. She seemed to be in good spirits. Her affect appeared  within a wide range. Her thought processes were coherent and organized without loose associations, verbal perseverations or flight of ideas. She was deemed to be a reliable informant. Her thought content was devoid of unusual or bizarre ideas.  Evaluation Procedures In addition to a review of medical records as well as clinical interview, the following tests or questionnaires were administered: Animal Naming Test  Beck Depression Inventory-II Boston Naming Test Clock Drawing Test Controlled Oral Word Association Test Modified Wisconsin Card Sorting Test Rey Complex Figure: Copy Trail Making A & B Wechsler Adult Intelligence Scale-IV: Music therapist, Coding, Digit Span & Similarities  Wechsler Memory Scale-IV: Flexible Approach Wide Range Achievement Test-4: Word Reading Zung Self-rating Anxiety Scale  Assessment  Results Observations & Interpretative Considerations Test results were deemed to represent a valid measure of her cognitive functioning. She did not exhibit signs of physical or emotional discomfort. She did not report or display problems with vision (she wore her eyeglasses), hearing or motor control. She had no apparent problems understanding task instructions. She appeared to consistently maintain alertness, sustain attention and persist to task. There were no signs of careless, impulsive or perseverative responding. She was judged to have put forth optimal effort.    Her pre-morbid intellectual potential was estimated to fall within the Average range based on her educational background coupled with a measure of word reading (Wide Range Achievement Test-4) that is usually resistant to the effects of neurological disorder.  Her test scores were corrected to reflect norms for her age and, whenever possible, her gender, race, race (i.e., African-American) and educational level (i.e., 17 years).   Cognitive Test Results She performed within normal expectations on tests of neurocognitive functioning.   She performed within the High Average range on a measure of phonemic fluency (Controlled Oral Word Association Test).   She performed within the Average range on measures of visual processing speed (WAIS-IV Coding, Trails A), complex visual sequencing with set shifting (Trails B), auditory working memory ([employer] Span), immediate and delayed memory (Wechsler Memory Scale-IV (WMS-IV) Flexible Approach), semantic fluency Banker), naming to confrontation [employer]), word reading (Wide Range Achievement Test-4),  constructional praxis (Rey Complex Figure; Clock Drawing Test), verbal abstract reasoning (WAIS-IV Similarities) and nonverbal problem-solving [employer]uary [employer]  Test).   She performed within the Low Average range on a measure of visual-spatial assembly  Product manager.  Psychological Symptom Questionnaires On the Beck Depression Inventory-II, her score of 18 fell within the mild range. Her most prominent symptoms (i.e., 2 on a 0 - 3 scale) were self-dislike and lack of energy. Of note, she did not endorse having had suicidal thoughts within the past two weeks. On the Zung Self-rating Anxiety Scale, her score of 31 was within the normal range.   Summary & Conclusions Alexssa Lattanzio is a 65 year-old woman who reported a mild decline in memory as well as increased depression over the past year. Neuropsychological testing did not reveal any signs of cognitive dysfunction. Her level of depression was mild and of a chronic nature. In sum, it is likely that her depression in conjunction with her lifestyle lacking in intellectual and social stimulation is contributing to her subjective sense of cognitive dampening.  Diagnostic Impressions Complaints of memory disturbance [R41.3] Persistent mild depressive disorder (Dysthymic disorder) [F34.1]  Recommendations 1. She was advised to give psychological counseling another try. I do not believe that she needs psychiatric medication.  2. She was encouraged to check out recreational and social activities offered at her local senior center.   The results and recommendations from this evaluation were discussed with her on 04/05/16. She was encouraged to comply with medical advice to best control her diabetes and hypertension in order to maintain vascular brain health.    I have appreciated the opportunity to evaluate Ms. Chamorro. Please feel free to contact me with any comments or questions.     ______________________ Jamey Ripa, Ph.D Licensed Psychologist       ADDENDUM-NEUROPSYCHOLOGICAL TEST RESULTS  Animal Naming Test Score= 74  70th (adjusted for age, gender, race and educational level)   Boston Naming Test Score=  54/60   55th (adjusted for age, gender, race and  educational level)   Clock Drawing Test Score=9/10 Normal; hesitant while drawing hands   Controlled Oral Word Association Test Score=  51 words/0 repetitions 88th  (adjusted for age, gender, race and educational level)   Modified Wisconsin Card Sorting Test  Categories correct    6    62nd (adjusted for age and education)  Perseverative errors   3       14th               Total errors    7      21st             Executive function composite 93     32nd             Rey Complex Figure: copy  Score= 31/36 Normal; took long time, multiple erasures    Trails A Score=  44s  0e    29th (adjusted for age, gender, race and educational level)  Trails B Score= 94s 1e  45th (adjusted for age, gender, race and educational level)   Wechsler Adult Intelligence Scale-IV   Subtest Scaled Score Percentile  Block Design      6      9th       Similarities      8    25th        Digit Span   Forward   Backward   Sequencing    10    12      8    10     50th  75th         25th           50th           Coding          37th         Wechsler Memory Scale-IV Flexible Approach Index Index Score Percentile  Immediate Memory    93 32nd           Auditory Memory  102 55th          Visual Memory  100 50th            Delayed Memory  109 73rd            Wide Range Achievement Test-4 Subtest  Raw score Standard score Percentile  Word Reading 61/70 99 47th

## 2016-06-21 ENCOUNTER — Encounter (INDEPENDENT_AMBULATORY_CARE_PROVIDER_SITE_OTHER): Payer: BLUE CROSS/BLUE SHIELD | Admitting: Ophthalmology

## 2016-06-21 DIAGNOSIS — H2513 Age-related nuclear cataract, bilateral: Secondary | ICD-10-CM

## 2016-06-21 DIAGNOSIS — E11311 Type 2 diabetes mellitus with unspecified diabetic retinopathy with macular edema: Secondary | ICD-10-CM

## 2016-06-21 DIAGNOSIS — H43813 Vitreous degeneration, bilateral: Secondary | ICD-10-CM

## 2016-06-21 DIAGNOSIS — E113513 Type 2 diabetes mellitus with proliferative diabetic retinopathy with macular edema, bilateral: Secondary | ICD-10-CM | POA: Diagnosis not present

## 2016-06-21 DIAGNOSIS — I10 Essential (primary) hypertension: Secondary | ICD-10-CM | POA: Diagnosis not present

## 2016-06-21 DIAGNOSIS — H35033 Hypertensive retinopathy, bilateral: Secondary | ICD-10-CM | POA: Diagnosis not present

## 2016-06-21 DIAGNOSIS — H4311 Vitreous hemorrhage, right eye: Secondary | ICD-10-CM | POA: Diagnosis not present

## 2016-07-19 ENCOUNTER — Encounter (INDEPENDENT_AMBULATORY_CARE_PROVIDER_SITE_OTHER): Payer: BLUE CROSS/BLUE SHIELD | Admitting: Ophthalmology

## 2016-07-19 DIAGNOSIS — E113513 Type 2 diabetes mellitus with proliferative diabetic retinopathy with macular edema, bilateral: Secondary | ICD-10-CM

## 2016-07-19 DIAGNOSIS — H35033 Hypertensive retinopathy, bilateral: Secondary | ICD-10-CM

## 2016-07-19 DIAGNOSIS — H43813 Vitreous degeneration, bilateral: Secondary | ICD-10-CM | POA: Diagnosis not present

## 2016-07-19 DIAGNOSIS — E11311 Type 2 diabetes mellitus with unspecified diabetic retinopathy with macular edema: Secondary | ICD-10-CM | POA: Diagnosis not present

## 2016-07-19 DIAGNOSIS — I10 Essential (primary) hypertension: Secondary | ICD-10-CM

## 2016-07-19 DIAGNOSIS — H2513 Age-related nuclear cataract, bilateral: Secondary | ICD-10-CM

## 2016-07-19 DIAGNOSIS — H4311 Vitreous hemorrhage, right eye: Secondary | ICD-10-CM

## 2016-08-16 ENCOUNTER — Encounter (INDEPENDENT_AMBULATORY_CARE_PROVIDER_SITE_OTHER): Payer: BLUE CROSS/BLUE SHIELD | Admitting: Ophthalmology

## 2016-08-16 DIAGNOSIS — H4311 Vitreous hemorrhage, right eye: Secondary | ICD-10-CM

## 2016-08-16 DIAGNOSIS — H43813 Vitreous degeneration, bilateral: Secondary | ICD-10-CM

## 2016-08-16 DIAGNOSIS — H35033 Hypertensive retinopathy, bilateral: Secondary | ICD-10-CM | POA: Diagnosis not present

## 2016-08-16 DIAGNOSIS — I10 Essential (primary) hypertension: Secondary | ICD-10-CM | POA: Diagnosis not present

## 2016-08-16 DIAGNOSIS — E11311 Type 2 diabetes mellitus with unspecified diabetic retinopathy with macular edema: Secondary | ICD-10-CM

## 2016-08-16 DIAGNOSIS — E113513 Type 2 diabetes mellitus with proliferative diabetic retinopathy with macular edema, bilateral: Secondary | ICD-10-CM | POA: Diagnosis not present

## 2016-09-13 ENCOUNTER — Encounter (INDEPENDENT_AMBULATORY_CARE_PROVIDER_SITE_OTHER): Payer: BLUE CROSS/BLUE SHIELD | Admitting: Ophthalmology

## 2016-09-13 DIAGNOSIS — I10 Essential (primary) hypertension: Secondary | ICD-10-CM | POA: Diagnosis not present

## 2016-09-13 DIAGNOSIS — H43813 Vitreous degeneration, bilateral: Secondary | ICD-10-CM

## 2016-09-13 DIAGNOSIS — H35033 Hypertensive retinopathy, bilateral: Secondary | ICD-10-CM | POA: Diagnosis not present

## 2016-09-13 DIAGNOSIS — E113513 Type 2 diabetes mellitus with proliferative diabetic retinopathy with macular edema, bilateral: Secondary | ICD-10-CM

## 2016-09-13 DIAGNOSIS — E11311 Type 2 diabetes mellitus with unspecified diabetic retinopathy with macular edema: Secondary | ICD-10-CM

## 2016-10-11 ENCOUNTER — Encounter (INDEPENDENT_AMBULATORY_CARE_PROVIDER_SITE_OTHER): Payer: BLUE CROSS/BLUE SHIELD | Admitting: Ophthalmology

## 2016-10-11 DIAGNOSIS — I10 Essential (primary) hypertension: Secondary | ICD-10-CM

## 2016-10-11 DIAGNOSIS — H35033 Hypertensive retinopathy, bilateral: Secondary | ICD-10-CM

## 2016-10-11 DIAGNOSIS — E113513 Type 2 diabetes mellitus with proliferative diabetic retinopathy with macular edema, bilateral: Secondary | ICD-10-CM | POA: Diagnosis not present

## 2016-10-11 DIAGNOSIS — E11311 Type 2 diabetes mellitus with unspecified diabetic retinopathy with macular edema: Secondary | ICD-10-CM

## 2016-10-11 DIAGNOSIS — H43813 Vitreous degeneration, bilateral: Secondary | ICD-10-CM

## 2016-10-11 DIAGNOSIS — H2513 Age-related nuclear cataract, bilateral: Secondary | ICD-10-CM

## 2016-10-11 DIAGNOSIS — H4313 Vitreous hemorrhage, bilateral: Secondary | ICD-10-CM

## 2016-11-01 ENCOUNTER — Encounter (INDEPENDENT_AMBULATORY_CARE_PROVIDER_SITE_OTHER): Payer: BLUE CROSS/BLUE SHIELD | Admitting: Ophthalmology

## 2016-11-01 DIAGNOSIS — H4313 Vitreous hemorrhage, bilateral: Secondary | ICD-10-CM

## 2016-11-01 DIAGNOSIS — E11311 Type 2 diabetes mellitus with unspecified diabetic retinopathy with macular edema: Secondary | ICD-10-CM

## 2016-11-01 DIAGNOSIS — E113513 Type 2 diabetes mellitus with proliferative diabetic retinopathy with macular edema, bilateral: Secondary | ICD-10-CM | POA: Diagnosis not present

## 2016-11-01 DIAGNOSIS — H43813 Vitreous degeneration, bilateral: Secondary | ICD-10-CM

## 2016-11-01 DIAGNOSIS — H35033 Hypertensive retinopathy, bilateral: Secondary | ICD-10-CM

## 2016-11-01 DIAGNOSIS — I10 Essential (primary) hypertension: Secondary | ICD-10-CM

## 2016-11-29 ENCOUNTER — Encounter (INDEPENDENT_AMBULATORY_CARE_PROVIDER_SITE_OTHER): Payer: BLUE CROSS/BLUE SHIELD | Admitting: Ophthalmology

## 2016-11-29 DIAGNOSIS — E113513 Type 2 diabetes mellitus with proliferative diabetic retinopathy with macular edema, bilateral: Secondary | ICD-10-CM

## 2016-11-29 DIAGNOSIS — H4313 Vitreous hemorrhage, bilateral: Secondary | ICD-10-CM

## 2016-11-29 DIAGNOSIS — H35033 Hypertensive retinopathy, bilateral: Secondary | ICD-10-CM

## 2016-11-29 DIAGNOSIS — E11311 Type 2 diabetes mellitus with unspecified diabetic retinopathy with macular edema: Secondary | ICD-10-CM | POA: Diagnosis not present

## 2016-11-29 DIAGNOSIS — H43813 Vitreous degeneration, bilateral: Secondary | ICD-10-CM

## 2016-11-29 DIAGNOSIS — I10 Essential (primary) hypertension: Secondary | ICD-10-CM | POA: Diagnosis not present

## 2016-12-10 ENCOUNTER — Other Ambulatory Visit (INDEPENDENT_AMBULATORY_CARE_PROVIDER_SITE_OTHER): Payer: BLUE CROSS/BLUE SHIELD | Admitting: Ophthalmology

## 2016-12-10 DIAGNOSIS — E113512 Type 2 diabetes mellitus with proliferative diabetic retinopathy with macular edema, left eye: Secondary | ICD-10-CM | POA: Diagnosis not present

## 2016-12-10 DIAGNOSIS — E11311 Type 2 diabetes mellitus with unspecified diabetic retinopathy with macular edema: Secondary | ICD-10-CM | POA: Diagnosis not present

## 2016-12-17 ENCOUNTER — Other Ambulatory Visit (INDEPENDENT_AMBULATORY_CARE_PROVIDER_SITE_OTHER): Payer: BLUE CROSS/BLUE SHIELD | Admitting: Ophthalmology

## 2016-12-17 DIAGNOSIS — E113511 Type 2 diabetes mellitus with proliferative diabetic retinopathy with macular edema, right eye: Secondary | ICD-10-CM

## 2016-12-17 DIAGNOSIS — E11311 Type 2 diabetes mellitus with unspecified diabetic retinopathy with macular edema: Secondary | ICD-10-CM

## 2016-12-27 ENCOUNTER — Encounter (INDEPENDENT_AMBULATORY_CARE_PROVIDER_SITE_OTHER): Payer: BLUE CROSS/BLUE SHIELD | Admitting: Ophthalmology

## 2016-12-27 DIAGNOSIS — I10 Essential (primary) hypertension: Secondary | ICD-10-CM

## 2016-12-27 DIAGNOSIS — E11311 Type 2 diabetes mellitus with unspecified diabetic retinopathy with macular edema: Secondary | ICD-10-CM

## 2016-12-27 DIAGNOSIS — H35033 Hypertensive retinopathy, bilateral: Secondary | ICD-10-CM

## 2016-12-27 DIAGNOSIS — E113313 Type 2 diabetes mellitus with moderate nonproliferative diabetic retinopathy with macular edema, bilateral: Secondary | ICD-10-CM | POA: Diagnosis not present

## 2016-12-27 DIAGNOSIS — H43813 Vitreous degeneration, bilateral: Secondary | ICD-10-CM

## 2017-01-07 ENCOUNTER — Other Ambulatory Visit (INDEPENDENT_AMBULATORY_CARE_PROVIDER_SITE_OTHER): Payer: Medicare HMO | Admitting: Ophthalmology

## 2017-01-07 DIAGNOSIS — E113512 Type 2 diabetes mellitus with proliferative diabetic retinopathy with macular edema, left eye: Secondary | ICD-10-CM

## 2017-01-07 DIAGNOSIS — E11311 Type 2 diabetes mellitus with unspecified diabetic retinopathy with macular edema: Secondary | ICD-10-CM | POA: Diagnosis not present

## 2017-01-21 ENCOUNTER — Other Ambulatory Visit (INDEPENDENT_AMBULATORY_CARE_PROVIDER_SITE_OTHER): Payer: Medicare HMO | Admitting: Ophthalmology

## 2017-01-21 DIAGNOSIS — E113511 Type 2 diabetes mellitus with proliferative diabetic retinopathy with macular edema, right eye: Secondary | ICD-10-CM

## 2017-01-21 DIAGNOSIS — E11311 Type 2 diabetes mellitus with unspecified diabetic retinopathy with macular edema: Secondary | ICD-10-CM | POA: Diagnosis not present

## 2017-01-25 DIAGNOSIS — R69 Illness, unspecified: Secondary | ICD-10-CM | POA: Diagnosis not present

## 2017-01-28 ENCOUNTER — Encounter (INDEPENDENT_AMBULATORY_CARE_PROVIDER_SITE_OTHER): Payer: Medicare HMO | Admitting: Ophthalmology

## 2017-01-28 DIAGNOSIS — H35033 Hypertensive retinopathy, bilateral: Secondary | ICD-10-CM

## 2017-01-28 DIAGNOSIS — I10 Essential (primary) hypertension: Secondary | ICD-10-CM

## 2017-01-28 DIAGNOSIS — E113513 Type 2 diabetes mellitus with proliferative diabetic retinopathy with macular edema, bilateral: Secondary | ICD-10-CM

## 2017-01-28 DIAGNOSIS — H43813 Vitreous degeneration, bilateral: Secondary | ICD-10-CM

## 2017-01-28 DIAGNOSIS — E11311 Type 2 diabetes mellitus with unspecified diabetic retinopathy with macular edema: Secondary | ICD-10-CM | POA: Diagnosis not present

## 2017-02-02 DIAGNOSIS — R69 Illness, unspecified: Secondary | ICD-10-CM | POA: Diagnosis not present

## 2017-02-13 DIAGNOSIS — R69 Illness, unspecified: Secondary | ICD-10-CM | POA: Diagnosis not present

## 2017-02-25 ENCOUNTER — Encounter (INDEPENDENT_AMBULATORY_CARE_PROVIDER_SITE_OTHER): Payer: Medicare HMO | Admitting: Ophthalmology

## 2017-03-01 DIAGNOSIS — R69 Illness, unspecified: Secondary | ICD-10-CM | POA: Diagnosis not present

## 2017-03-11 ENCOUNTER — Encounter (INDEPENDENT_AMBULATORY_CARE_PROVIDER_SITE_OTHER): Payer: Medicare HMO | Admitting: Ophthalmology

## 2017-03-11 DIAGNOSIS — E113513 Type 2 diabetes mellitus with proliferative diabetic retinopathy with macular edema, bilateral: Secondary | ICD-10-CM

## 2017-03-11 DIAGNOSIS — H35033 Hypertensive retinopathy, bilateral: Secondary | ICD-10-CM

## 2017-03-11 DIAGNOSIS — I10 Essential (primary) hypertension: Secondary | ICD-10-CM

## 2017-03-11 DIAGNOSIS — H4313 Vitreous hemorrhage, bilateral: Secondary | ICD-10-CM

## 2017-03-11 DIAGNOSIS — E11311 Type 2 diabetes mellitus with unspecified diabetic retinopathy with macular edema: Secondary | ICD-10-CM

## 2017-03-12 DIAGNOSIS — R69 Illness, unspecified: Secondary | ICD-10-CM | POA: Diagnosis not present

## 2017-03-23 LAB — BASIC METABOLIC PANEL: Glucose: 207

## 2017-03-28 DIAGNOSIS — R69 Illness, unspecified: Secondary | ICD-10-CM | POA: Diagnosis not present

## 2017-04-03 DIAGNOSIS — H33333 Multiple defects of retina without detachment, bilateral: Secondary | ICD-10-CM | POA: Diagnosis not present

## 2017-04-03 DIAGNOSIS — E0822 Diabetes mellitus due to underlying condition with diabetic chronic kidney disease: Secondary | ICD-10-CM | POA: Diagnosis not present

## 2017-04-03 DIAGNOSIS — I10 Essential (primary) hypertension: Secondary | ICD-10-CM | POA: Diagnosis not present

## 2017-04-03 DIAGNOSIS — R69 Illness, unspecified: Secondary | ICD-10-CM | POA: Diagnosis not present

## 2017-04-03 DIAGNOSIS — E0829 Diabetes mellitus due to underlying condition with other diabetic kidney complication: Secondary | ICD-10-CM | POA: Diagnosis not present

## 2017-04-03 DIAGNOSIS — E118 Type 2 diabetes mellitus with unspecified complications: Secondary | ICD-10-CM | POA: Diagnosis not present

## 2017-04-09 ENCOUNTER — Encounter (INDEPENDENT_AMBULATORY_CARE_PROVIDER_SITE_OTHER): Payer: Medicare HMO | Admitting: Ophthalmology

## 2017-04-09 DIAGNOSIS — I10 Essential (primary) hypertension: Secondary | ICD-10-CM | POA: Diagnosis not present

## 2017-04-09 DIAGNOSIS — H4312 Vitreous hemorrhage, left eye: Secondary | ICD-10-CM

## 2017-04-09 DIAGNOSIS — H35033 Hypertensive retinopathy, bilateral: Secondary | ICD-10-CM

## 2017-04-09 DIAGNOSIS — E11311 Type 2 diabetes mellitus with unspecified diabetic retinopathy with macular edema: Secondary | ICD-10-CM | POA: Diagnosis not present

## 2017-04-09 DIAGNOSIS — E113513 Type 2 diabetes mellitus with proliferative diabetic retinopathy with macular edema, bilateral: Secondary | ICD-10-CM | POA: Diagnosis not present

## 2017-04-09 DIAGNOSIS — H2513 Age-related nuclear cataract, bilateral: Secondary | ICD-10-CM | POA: Diagnosis not present

## 2017-04-10 DIAGNOSIS — R69 Illness, unspecified: Secondary | ICD-10-CM | POA: Diagnosis not present

## 2017-04-10 NOTE — H&P (Signed)
Kelly Blair is an 66 y.o. female.   Chief Complaint:loss of vision left eye.  Large floaters HPI: diabetic with vitreous hemorrhage and severe retinopathy left eye  Past Medical History:  Diagnosis Date  . Diabetes mellitus   . Hypercholesterolemia   . Hypertension   . TIA (transient ischemic attack) 07/2013    Past Surgical History:  Procedure Laterality Date  . fibroid removal from uterus    . left carpal tunnel repair    . pinched nerve left arm      Family History  Problem Relation Age of Onset  . Uterine cancer Mother        deceased, natural causes, dementia  . Diabetes Mother   . Heart disease Father        deceased secondary to septicemia   Social History:  reports that she has never smoked. She does not have any smokeless tobacco history on file. She reports that she does not drink alcohol or use drugs.  Allergies: No Known Allergies  No prescriptions prior to admission.    Review of systems otherwise negative  There were no vitals taken for this visit.  Physical exam: Mental status: oriented x3. Eyes: See eye exam associated with this date of surgery in media tab.  Scanned in by scanning center Ears, Nose, Throat: within normal limits Neck: Within Normal limits General: within normal limits Chest: Within normal limits Breast: deferred Heart: Within normal limits Abdomen: Within normal limits GU: deferred Extremities: within normal limits Skin: within normal limits  Assessment/Plan Proliferative diabetic retinopathy left eye.  Vitreous hemorrhage left eye Plan: To [facility] for Pars plana vitrectomy, laser, membrane peel, gas injection left eye  Amayrany Cafaro D 04/10/2017, 7:56 AM

## 2017-04-20 ENCOUNTER — Encounter (HOSPITAL_COMMUNITY): Payer: Self-pay | Admitting: *Deleted

## 2017-04-20 NOTE — Progress Notes (Signed)
Patient denies chest pain, shortness of breath. Reports that PCP is [facility] and that she sees [facility]. Patient states that they have told her she it not at the point where dialysis needs to be considered.

## 2017-04-23 ENCOUNTER — Encounter (HOSPITAL_COMMUNITY): Payer: Self-pay

## 2017-04-23 ENCOUNTER — Ambulatory Visit (HOSPITAL_COMMUNITY)
Admission: RE | Admit: 2017-04-23 | Discharge: 2017-04-24 | Disposition: A | Discharge status: Home / Self Care | Payer: Medicare HMO | Source: Ambulatory Visit | Attending: Ophthalmology | Admitting: Ophthalmology

## 2017-04-23 ENCOUNTER — Ambulatory Visit (HOSPITAL_COMMUNITY): Payer: Medicare HMO | Admitting: Anesthesiology

## 2017-04-23 ENCOUNTER — Encounter (HOSPITAL_COMMUNITY)
Admission: RE | Disposition: A | Discharge status: Home / Self Care | Payer: Self-pay | Source: Ambulatory Visit | Attending: Ophthalmology

## 2017-04-23 DIAGNOSIS — I10 Essential (primary) hypertension: Secondary | ICD-10-CM | POA: Diagnosis not present

## 2017-04-23 DIAGNOSIS — H4312 Vitreous hemorrhage, left eye: Secondary | ICD-10-CM | POA: Insufficient documentation

## 2017-04-23 DIAGNOSIS — Z8673 Personal history of transient ischemic attack (TIA), and cerebral infarction without residual deficits: Secondary | ICD-10-CM | POA: Insufficient documentation

## 2017-04-23 DIAGNOSIS — Z7984 Long term (current) use of oral hypoglycemic drugs: Secondary | ICD-10-CM | POA: Diagnosis not present

## 2017-04-23 DIAGNOSIS — E7800 Pure hypercholesterolemia, unspecified: Secondary | ICD-10-CM | POA: Diagnosis not present

## 2017-04-23 DIAGNOSIS — H43312 Vitreous membranes and strands, left eye: Secondary | ICD-10-CM | POA: Diagnosis present

## 2017-04-23 DIAGNOSIS — Z79899 Other long term (current) drug therapy: Secondary | ICD-10-CM | POA: Insufficient documentation

## 2017-04-23 DIAGNOSIS — E113592 Type 2 diabetes mellitus with proliferative diabetic retinopathy without macular edema, left eye: Secondary | ICD-10-CM | POA: Diagnosis not present

## 2017-04-23 DIAGNOSIS — E113512 Type 2 diabetes mellitus with proliferative diabetic retinopathy with macular edema, left eye: Secondary | ICD-10-CM | POA: Diagnosis not present

## 2017-04-23 DIAGNOSIS — E784 Other hyperlipidemia: Secondary | ICD-10-CM | POA: Diagnosis not present

## 2017-04-23 HISTORY — PX: PHOTOCOAGULATION WITH LASER: SHX6027

## 2017-04-23 HISTORY — PX: PARS PLANA VITRECTOMY 27 GAUGE: SHX6738

## 2017-04-23 HISTORY — DX: Chronic kidney disease, stage 3 (moderate): N18.3

## 2017-04-23 HISTORY — DX: Type 2 diabetes mellitus without complications: E11.9

## 2017-04-23 HISTORY — DX: Chronic kidney disease, stage 3 unspecified: N18.30

## 2017-04-23 LAB — CBC
HEMATOCRIT: 30.6 % — AB (ref 36.0–46.0)
HEMOGLOBIN: 9.7 g/dL — AB (ref 12.0–15.0)
MCH: 26 pg (ref 26.0–34.0)
MCHC: 31.7 g/dL (ref 30.0–36.0)
MCV: 82 fL (ref 78.0–100.0)
Platelets: 264 10*3/uL (ref 150–400)
RBC: 3.73 MIL/uL — AB (ref 3.87–5.11)
RDW: 15.7 % — ABNORMAL HIGH (ref 11.5–15.5)
WBC: 10 10*3/uL (ref 4.0–10.5)

## 2017-04-23 LAB — BASIC METABOLIC PANEL
ANION GAP: 10 (ref 5–15)
BUN: 30 mg/dL — AB (ref 6–20)
CO2: 21 mmol/L — ABNORMAL LOW (ref 22–32)
Calcium: 8.7 mg/dL — ABNORMAL LOW (ref 8.9–10.3)
Chloride: 108 mmol/L (ref 101–111)
Creatinine, Ser: 1.98 mg/dL — ABNORMAL HIGH (ref 0.44–1.00)
GFR calc Af Amer: 29 mL/min — ABNORMAL LOW (ref >60)
GFR, EST NON AFRICAN AMERICAN: 25 mL/min — AB (ref >60)
Glucose, Bld: 105 mg/dL — ABNORMAL HIGH (ref 65–99)
POTASSIUM: 4.1 mmol/L (ref 3.5–5.1)
SODIUM: 139 mmol/L (ref 135–145)

## 2017-04-23 LAB — GLUCOSE, CAPILLARY
GLUCOSE-CAPILLARY: 115 mg/dL — AB (ref 65–99)
Glucose-Capillary: 116 mg/dL — ABNORMAL HIGH (ref 65–99)
Glucose-Capillary: 260 mg/dL — ABNORMAL HIGH (ref 65–99)
Glucose-Capillary: 245 mg/dL — ABNORMAL HIGH (ref 65–99)

## 2017-04-23 SURGERY — PARS PLANA VITRECTOMY 27 GAUGE
Anesthesia: General | Site: Eye | Laterality: Left

## 2017-04-23 MED ORDER — BACITRACIN-POLYMYXIN B 500-10000 UNIT/GM OP OINT
1.0000 "application " | TOPICAL_OINTMENT | Freq: Three times a day (TID) | OPHTHALMIC | Status: DC
  Filled 2017-04-23: qty 3.5

## 2017-04-23 MED ORDER — STERILE WATER FOR INJECTION IJ SOLN
INTRAMUSCULAR | Status: AC
  Filled 2017-04-23: qty 20

## 2017-04-23 MED ORDER — STERILE WATER FOR INJECTION IJ SOLN
INTRAMUSCULAR | Status: DC | PRN
  Administered 2017-04-23: 20 mL

## 2017-04-23 MED ORDER — CARVEDILOL 12.5 MG PO TABS
ORAL_TABLET | ORAL | Status: AC
  Administered 2017-04-23: 25 mg via ORAL
  Filled 2017-04-23: qty 2

## 2017-04-23 MED ORDER — DOXAZOSIN MESYLATE 4 MG PO TABS
4.0000 mg | ORAL_TABLET | Freq: Every day | ORAL | Status: DC
  Administered 2017-04-23: 4 mg via ORAL
  Filled 2017-04-23: qty 1

## 2017-04-23 MED ORDER — TRIAMCINOLONE ACETONIDE 40 MG/ML IJ SUSP
INTRAMUSCULAR | Status: DC | PRN
  Administered 2017-04-23: 40 mg

## 2017-04-23 MED ORDER — CEFTAZIDIME 1 G IJ SOLR
INTRAMUSCULAR | Status: AC
  Filled 2017-04-23: qty 1

## 2017-04-23 MED ORDER — TRIAMCINOLONE ACETONIDE 40 MG/ML IJ SUSP
INTRAMUSCULAR | Status: AC
  Filled 2017-04-23: qty 5

## 2017-04-23 MED ORDER — PHENYLEPHRINE HCL 2.5 % OP SOLN
1.0000 [drp] | OPHTHALMIC | Status: AC | PRN
  Administered 2017-04-23 (×3): 1 [drp] via OPHTHALMIC

## 2017-04-23 MED ORDER — TROPICAMIDE 1 % OP SOLN
1.0000 [drp] | OPHTHALMIC | Status: DC | PRN
  Filled 2017-04-23: qty 15

## 2017-04-23 MED ORDER — BSS PLUS IO SOLN
INTRAOCULAR | Status: DC | PRN
  Administered 2017-04-23: 1 via INTRAOCULAR

## 2017-04-23 MED ORDER — 0.9 % SODIUM CHLORIDE (POUR BTL) OPTIME
TOPICAL | Status: DC | PRN
  Administered 2017-04-23: 1000 mL

## 2017-04-23 MED ORDER — ADULT MULTIVITAMIN W/MINERALS CH: 1.0000 | ORAL_TABLET | ORAL | Status: DC

## 2017-04-23 MED ORDER — HYPROMELLOSE (GONIOSCOPIC) 2.5 % OP SOLN
OPHTHALMIC | Status: AC
  Filled 2017-04-23: qty 15

## 2017-04-23 MED ORDER — GATIFLOXACIN 0.5 % OP SOLN
1.0000 [drp] | OPHTHALMIC | Status: DC | PRN
  Filled 2017-04-23: qty 2.5

## 2017-04-23 MED ORDER — ACETAZOLAMIDE SODIUM 500 MG IJ SOLR
INTRAMUSCULAR | Status: AC
  Filled 2017-04-23: qty 500

## 2017-04-23 MED ORDER — PROPOFOL 10 MG/ML IV BOLUS
INTRAVENOUS | Status: AC
  Filled 2017-04-23: qty 20

## 2017-04-23 MED ORDER — DEXAMETHASONE SODIUM PHOSPHATE 10 MG/ML IJ SOLN
INTRAMUSCULAR | Status: AC
  Filled 2017-04-23: qty 1

## 2017-04-23 MED ORDER — PROMETHAZINE HCL 25 MG/ML IJ SOLN: 6.2500 mg | INTRAMUSCULAR | Status: DC | PRN

## 2017-04-23 MED ORDER — ONDANSETRON HCL 4 MG/2ML IJ SOLN: 4.0000 mg | Freq: Four times a day (QID) | INTRAMUSCULAR | Status: DC | PRN

## 2017-04-23 MED ORDER — BUPIVACAINE-EPINEPHRINE (PF) 0.25% -1:200000 IJ SOLN
INTRAMUSCULAR | Status: AC
  Filled 2017-04-23: qty 30

## 2017-04-23 MED ORDER — ACETAMINOPHEN 325 MG PO TABS: 325.0000 mg | ORAL_TABLET | ORAL | Status: DC | PRN

## 2017-04-23 MED ORDER — PROPOFOL 10 MG/ML IV BOLUS
INTRAVENOUS | Status: DC | PRN
  Administered 2017-04-23: 200 mg via INTRAVENOUS

## 2017-04-23 MED ORDER — TEMAZEPAM 15 MG PO CAPS: 15.0000 mg | ORAL_CAPSULE | Freq: Every evening | ORAL | Status: DC | PRN

## 2017-04-23 MED ORDER — HYDROMORPHONE HCL 1 MG/ML IJ SOLN: 0.2500 mg | INTRAMUSCULAR | Status: DC | PRN

## 2017-04-23 MED ORDER — GATIFLOXACIN 0.5 % OP SOLN
1.0000 [drp] | OPHTHALMIC | Status: AC | PRN
  Administered 2017-04-23 (×3): 1 [drp] via OPHTHALMIC

## 2017-04-23 MED ORDER — FENTANYL CITRATE (PF) 100 MCG/2ML IJ SOLN
INTRAMUSCULAR | Status: DC | PRN
  Administered 2017-04-23: 50 ug via INTRAVENOUS

## 2017-04-23 MED ORDER — ATROPINE SULFATE 1 % OP SOLN
OPHTHALMIC | Status: AC
  Filled 2017-04-23: qty 5

## 2017-04-23 MED ORDER — SUGAMMADEX SODIUM 500 MG/5ML IV SOLN
INTRAVENOUS | Status: AC
  Filled 2017-04-23: qty 5

## 2017-04-23 MED ORDER — BSS PLUS IO SOLN
INTRAOCULAR | Status: AC
  Filled 2017-04-23: qty 500

## 2017-04-23 MED ORDER — CYCLOPENTOLATE HCL 1 % OP SOLN
1.0000 [drp] | OPHTHALMIC | Status: AC | PRN
  Administered 2017-04-23 (×3): 1 [drp] via OPHTHALMIC

## 2017-04-23 MED ORDER — SUGAMMADEX SODIUM 200 MG/2ML IV SOLN
INTRAVENOUS | Status: DC | PRN
  Administered 2017-04-23: 384 mg via INTRAVENOUS

## 2017-04-23 MED ORDER — SODIUM CHLORIDE 0.45 % IV SOLN
INTRAVENOUS | Status: DC
  Administered 2017-04-23: 14:00:00 via INTRAVENOUS

## 2017-04-23 MED ORDER — BSS IO SOLN
INTRAOCULAR | Status: AC
  Filled 2017-04-23: qty 15

## 2017-04-23 MED ORDER — BRIMONIDINE TARTRATE 0.15 % OP SOLN
1.0000 [drp] | Freq: Two times a day (BID) | OPHTHALMIC | Status: DC
  Administered 2017-04-23: 1 [drp] via OPHTHALMIC
  Filled 2017-04-23: qty 5

## 2017-04-23 MED ORDER — TETRACAINE HCL 0.5 % OP SOLN
2.0000 [drp] | Freq: Once | OPHTHALMIC | Status: DC
  Filled 2017-04-23: qty 4

## 2017-04-23 MED ORDER — SODIUM CHLORIDE 0.9 % IV SOLN
INTRAVENOUS | Status: DC
  Administered 2017-04-23: 09:00:00 via INTRAVENOUS

## 2017-04-23 MED ORDER — SODIUM HYALURONATE 10 MG/ML IO SOLN
INTRAOCULAR | Status: AC
  Filled 2017-04-23: qty 0.85

## 2017-04-23 MED ORDER — CARVEDILOL 25 MG PO TABS
25.0000 mg | ORAL_TABLET | Freq: Two times a day (BID) | ORAL | Status: DC
  Administered 2017-04-23 – 2017-04-24 (×2): 25 mg via ORAL
  Filled 2017-04-23 (×2): qty 1

## 2017-04-23 MED ORDER — HYALURONIDASE HUMAN 150 UNIT/ML IJ SOLN
INTRAMUSCULAR | Status: AC
  Filled 2017-04-23: qty 1

## 2017-04-23 MED ORDER — LIDOCAINE HCL (CARDIAC) 20 MG/ML IV SOLN
INTRAVENOUS | Status: DC | PRN
  Administered 2017-04-23: 100 mg via INTRAVENOUS

## 2017-04-23 MED ORDER — BSS IO SOLN
INTRAOCULAR | Status: DC | PRN
Start: 2017-04-23 — End: 2017-04-23
  Administered 2017-04-23: 15 mL via INTRAOCULAR

## 2017-04-23 MED ORDER — HYDROCODONE-ACETAMINOPHEN 5-325 MG PO TABS
ORAL_TABLET | ORAL | Status: AC
  Filled 2017-04-23: qty 2

## 2017-04-23 MED ORDER — LACTATED RINGERS IV SOLN
INTRAVENOUS | Status: DC | PRN
  Administered 2017-04-23: 09:00:00 via INTRAVENOUS

## 2017-04-23 MED ORDER — CYCLOPENTOLATE HCL 1 % OP SOLN
1.0000 [drp] | OPHTHALMIC | Status: DC | PRN
  Filled 2017-04-23: qty 2

## 2017-04-23 MED ORDER — HYDROCODONE-ACETAMINOPHEN 5-325 MG PO TABS
1.0000 | ORAL_TABLET | ORAL | Status: DC | PRN
  Administered 2017-04-23: 2 via ORAL
  Filled 2017-04-23: qty 2

## 2017-04-23 MED ORDER — SUGAMMADEX SODIUM 200 MG/2ML IV SOLN
INTRAVENOUS | Status: AC
  Filled 2017-04-23: qty 6

## 2017-04-23 MED ORDER — SODIUM HYALURONATE 10 MG/ML IO SOLN
INTRAOCULAR | Status: DC | PRN
Start: 2017-04-23 — End: 2017-04-23
  Administered 2017-04-23: 0.85 mL via INTRAOCULAR

## 2017-04-23 MED ORDER — PHENYLEPHRINE HCL 10 MG/ML IJ SOLN
INTRAMUSCULAR | Status: DC | PRN
  Administered 2017-04-23: 25 ug/min via INTRAVENOUS

## 2017-04-23 MED ORDER — BACITRACIN-POLYMYXIN B 500-10000 UNIT/GM OP OINT
TOPICAL_OINTMENT | OPHTHALMIC | Status: AC
  Filled 2017-04-23: qty 3.5

## 2017-04-23 MED ORDER — PREDNISOLONE ACETATE 1 % OP SUSP
1.0000 [drp] | Freq: Four times a day (QID) | OPHTHALMIC | Status: DC
  Filled 2017-04-23: qty 5

## 2017-04-23 MED ORDER — METFORMIN HCL 500 MG PO TABS
1000.0000 mg | ORAL_TABLET | Freq: Two times a day (BID) | ORAL | Status: DC
Start: 2017-04-23 — End: 2017-04-24
  Administered 2017-04-23 – 2017-04-24 (×2): 1000 mg via ORAL
  Filled 2017-04-23 (×2): qty 2

## 2017-04-23 MED ORDER — DEXAMETHASONE SODIUM PHOSPHATE 10 MG/ML IJ SOLN
INTRAMUSCULAR | Status: DC | PRN
Start: 2017-04-23 — End: 2017-04-23
  Administered 2017-04-23: 10 mg

## 2017-04-23 MED ORDER — FENTANYL CITRATE (PF) 250 MCG/5ML IJ SOLN
INTRAMUSCULAR | Status: AC
  Filled 2017-04-23: qty 5

## 2017-04-23 MED ORDER — BUPIVACAINE HCL (PF) 0.75 % IJ SOLN
INTRAMUSCULAR | Status: AC
  Filled 2017-04-23: qty 10

## 2017-04-23 MED ORDER — EPINEPHRINE PF 1 MG/ML IJ SOLN
INTRAMUSCULAR | Status: AC
  Filled 2017-04-23: qty 1

## 2017-04-23 MED ORDER — CARVEDILOL 25 MG PO TABS
25.0000 mg | ORAL_TABLET | Freq: Once | ORAL | Status: AC
  Administered 2017-04-23: 25 mg via ORAL
  Filled 2017-04-23: qty 1

## 2017-04-23 MED ORDER — INDAPAMIDE 2.5 MG PO TABS
2.5000 mg | ORAL_TABLET | Freq: Every day | ORAL | Status: DC
  Administered 2017-04-24: 2.5 mg via ORAL
  Filled 2017-04-23: qty 1

## 2017-04-23 MED ORDER — LOSARTAN POTASSIUM 50 MG PO TABS
25.0000 mg | ORAL_TABLET | Freq: Every day | ORAL | Status: DC
  Administered 2017-04-23: 25 mg via ORAL
  Filled 2017-04-23: qty 1

## 2017-04-23 MED ORDER — MORPHINE SULFATE (PF) 4 MG/ML IV SOLN: 1.0000 mg | INTRAVENOUS | Status: DC | PRN

## 2017-04-23 MED ORDER — LIDOCAINE HCL 2 % IJ SOLN
INTRAMUSCULAR | Status: AC
  Filled 2017-04-23: qty 20

## 2017-04-23 MED ORDER — ATROPINE SULFATE 1 % OP SOLN
OPHTHALMIC | Status: DC | PRN
  Administered 2017-04-23: 1 [drp] via OPHTHALMIC

## 2017-04-23 MED ORDER — MIDAZOLAM HCL 2 MG/2ML IJ SOLN
INTRAMUSCULAR | Status: AC
  Filled 2017-04-23: qty 2

## 2017-04-23 MED ORDER — BRIMONIDINE TARTRATE 0.2 % OP SOLN
1.0000 [drp] | Freq: Two times a day (BID) | OPHTHALMIC | Status: DC
  Filled 2017-04-23: qty 5

## 2017-04-23 MED ORDER — EPINEPHRINE PF 1 MG/ML IJ SOLN
INTRAMUSCULAR | Status: DC | PRN
Start: 2017-04-23 — End: 2017-04-23
  Administered 2017-04-23: .3 mL

## 2017-04-23 MED ORDER — SODIUM CHLORIDE 0.9 % IJ SOLN
INTRAMUSCULAR | Status: AC
  Filled 2017-04-23: qty 10

## 2017-04-23 MED ORDER — PHENYLEPHRINE 40 MCG/ML (10ML) SYRINGE FOR IV PUSH (FOR BLOOD PRESSURE SUPPORT)
PREFILLED_SYRINGE | INTRAVENOUS | Status: AC
  Filled 2017-04-23: qty 10

## 2017-04-23 MED ORDER — ACETAMINOPHEN 500 MG PO TABS: 1000.0000 mg | ORAL_TABLET | Freq: Four times a day (QID) | ORAL | Status: DC | PRN

## 2017-04-23 MED ORDER — BUPIVACAINE HCL (PF) 0.75 % IJ SOLN
INTRAMUSCULAR | Status: DC | PRN
  Administered 2017-04-23: 20 mL

## 2017-04-23 MED ORDER — PNEUMOCOCCAL VAC POLYVALENT 25 MCG/0.5ML IJ INJ: 0.5000 mL | INJECTION | INTRAMUSCULAR | Status: DC

## 2017-04-23 MED ORDER — ROCURONIUM BROMIDE 10 MG/ML (PF) SYRINGE
PREFILLED_SYRINGE | INTRAVENOUS | Status: AC
  Filled 2017-04-23: qty 10

## 2017-04-23 MED ORDER — LATANOPROST 0.005 % OP SOLN
1.0000 [drp] | Freq: Every day | OPHTHALMIC | Status: DC
  Filled 2017-04-23: qty 2.5

## 2017-04-23 MED ORDER — TOBRAMYCIN-DEXAMETHASONE 0.3-0.1 % OP OINT
TOPICAL_OINTMENT | OPHTHALMIC | Status: AC
  Filled 2017-04-23: qty 3.5

## 2017-04-23 MED ORDER — ROSUVASTATIN CALCIUM 10 MG PO TABS
10.0000 mg | ORAL_TABLET | Freq: Every day | ORAL | Status: DC
  Filled 2017-04-23 (×2): qty 1

## 2017-04-23 MED ORDER — MIDAZOLAM HCL 5 MG/5ML IJ SOLN
INTRAMUSCULAR | Status: DC | PRN
  Administered 2017-04-23: 2 mg via INTRAVENOUS

## 2017-04-23 MED ORDER — ONDANSETRON HCL 4 MG/2ML IJ SOLN: 4.0000 mg | Freq: Four times a day (QID) | INTRAMUSCULAR | Status: DC

## 2017-04-23 MED ORDER — INSULIN GLARGINE 100 UNIT/ML ~~LOC~~ SOLN
30.0000 [IU] | Freq: Every day | SUBCUTANEOUS | Status: DC
Start: 2017-04-23 — End: 2017-04-24
  Administered 2017-04-23: 30 [IU] via SUBCUTANEOUS
  Filled 2017-04-23: qty 0.3

## 2017-04-23 MED ORDER — PHENYLEPHRINE HCL 2.5 % OP SOLN
1.0000 [drp] | OPHTHALMIC | Status: DC | PRN
  Filled 2017-04-23: qty 2

## 2017-04-23 MED ORDER — BACITRACIN-POLYMYXIN B 500-10000 UNIT/GM OP OINT
TOPICAL_OINTMENT | OPHTHALMIC | Status: DC | PRN
  Administered 2017-04-23: 1 via OPHTHALMIC

## 2017-04-23 MED ORDER — LIDOCAINE 2% (20 MG/ML) 5 ML SYRINGE
INTRAMUSCULAR | Status: AC
  Filled 2017-04-23: qty 10

## 2017-04-23 MED ORDER — GATIFLOXACIN 0.5 % OP SOLN
1.0000 [drp] | Freq: Four times a day (QID) | OPHTHALMIC | Status: DC
  Filled 2017-04-23: qty 2.5

## 2017-04-23 MED ORDER — ROCURONIUM BROMIDE 100 MG/10ML IV SOLN
INTRAVENOUS | Status: DC | PRN
  Administered 2017-04-23: 60 mg via INTRAVENOUS

## 2017-04-23 MED ORDER — MAGNESIUM HYDROXIDE 400 MG/5ML PO SUSP: 15.0000 mL | Freq: Four times a day (QID) | ORAL | Status: DC | PRN

## 2017-04-23 MED ORDER — ONDANSETRON HCL 4 MG/2ML IJ SOLN
INTRAMUSCULAR | Status: DC | PRN
Start: 2017-04-23 — End: 2017-04-23
  Administered 2017-04-23: 4 mg via INTRAVENOUS

## 2017-04-23 MED ORDER — CLONIDINE HCL 0.1 MG PO TABS
0.1000 mg | ORAL_TABLET | Freq: Two times a day (BID) | ORAL | Status: DC
  Administered 2017-04-23: 0.1 mg via ORAL
  Filled 2017-04-23: qty 1

## 2017-04-23 MED ORDER — DORZOLAMIDE HCL 2 % OP SOLN
1.0000 [drp] | Freq: Three times a day (TID) | OPHTHALMIC | Status: DC
  Filled 2017-04-23: qty 10

## 2017-04-23 MED ORDER — ACETAZOLAMIDE SODIUM 500 MG IJ SOLR
500.0000 mg | Freq: Once | INTRAMUSCULAR | Status: AC
  Administered 2017-04-24: 500 mg via INTRAVENOUS
  Filled 2017-04-23: qty 500

## 2017-04-23 MED ORDER — EPHEDRINE 5 MG/ML INJ
INTRAVENOUS | Status: AC
  Filled 2017-04-23: qty 10

## 2017-04-23 MED ORDER — AMLODIPINE BESYLATE 10 MG PO TABS: 10.0000 mg | ORAL_TABLET | Freq: Every day | ORAL | Status: DC

## 2017-04-23 MED ORDER — POLYMYXIN B SULFATE 500000 UNITS IJ SOLR
INTRAMUSCULAR | Status: AC
  Filled 2017-04-23: qty 500000

## 2017-04-23 MED ORDER — DEXAMETHASONE SODIUM PHOSPHATE 10 MG/ML IJ SOLN
INTRAMUSCULAR | Status: DC | PRN
  Administered 2017-04-23: 10 mg via INTRAVENOUS

## 2017-04-23 MED ORDER — TROPICAMIDE 1 % OP SOLN
1.0000 [drp] | OPHTHALMIC | Status: AC | PRN
  Administered 2017-04-23 (×3): 1 [drp] via OPHTHALMIC

## 2017-04-23 MED ORDER — CEFAZOLIN SODIUM-DEXTROSE 2-4 GM/100ML-% IV SOLN
2.0000 g | INTRAVENOUS | Status: AC
  Administered 2017-04-23: 2 g via INTRAVENOUS
  Filled 2017-04-23: qty 100

## 2017-04-23 SURGICAL SUPPLY — 66 items
APPLICATOR DR MATTHEWS STRL (MISCELLANEOUS) IMPLANT
BLADE EYE CATARACT 19 1.4 BEAV (BLADE) IMPLANT
BLADE MVR KNIFE 19G (BLADE) IMPLANT
BLADE MVR KNIFE 20G (BLADE) IMPLANT
CANNULA ANTERIOR CHAMBER 27GA (MISCELLANEOUS) ×2 IMPLANT
CANNULA DUAL BORE 23G (CANNULA) IMPLANT
CANNULA TROCAR 25GA VLV (OPHTHALMIC) IMPLANT
CANNULA VLV SOFT TIP 27GA (OPHTHALMIC) ×2 IMPLANT
CORDS BIPOLAR (ELECTRODE) IMPLANT
COTTONBALL LRG STERILE PKG (GAUZE/BANDAGES/DRESSINGS) ×6 IMPLANT
COVER MAYO STAND STRL (DRAPES) IMPLANT
DRAPE INCISE 51X51 W/FILM STRL (DRAPES) IMPLANT
DRAPE OPHTHALMIC 77X100 STRL (CUSTOM PROCEDURE TRAY) ×2 IMPLANT
FILTER BLUE MILLIPORE (MISCELLANEOUS) IMPLANT
FILTER STRAW FLUID ASPIR (MISCELLANEOUS) IMPLANT
FORCEPS ECKARDT ILM 25G SERR (OPHTHALMIC RELATED) IMPLANT
FORCEPS GRIESHABER ILM 27G (INSTRUMENTS) IMPLANT
GLOVE SS BIOGEL STRL SZ 6.5 (GLOVE) ×1 IMPLANT
GLOVE SS BIOGEL STRL SZ 7 (GLOVE) ×1 IMPLANT
GLOVE SUPERSENSE BIOGEL SZ 6.5 (GLOVE) ×1
GLOVE SUPERSENSE BIOGEL SZ 7 (GLOVE) ×1
GLOVE SURG 8.5 LATEX PF (GLOVE) ×2 IMPLANT
GOWN STRL REUS W/ TWL LRG LVL3 (GOWN DISPOSABLE) ×3 IMPLANT
GOWN STRL REUS W/TWL LRG LVL3 (GOWN DISPOSABLE) ×3
HANDLE PNEUMATIC FOR CONSTEL (OPHTHALMIC) IMPLANT
KIT BASIN OR (CUSTOM PROCEDURE TRAY) ×2 IMPLANT
KNIFE CRESCENT 2.5 55 ANG (BLADE) IMPLANT
MICROPICK 25G (MISCELLANEOUS)
NEEDLE 18GX1X1/2 (RX/OR ONLY) (NEEDLE) ×2 IMPLANT
NEEDLE 25GX 5/8IN NON SAFETY (NEEDLE) ×2 IMPLANT
NEEDLE FILTER BLUNT 18X 1/2SAF (NEEDLE) ×1
NEEDLE FILTER BLUNT 18X1 1/2 (NEEDLE) ×1 IMPLANT
NEEDLE HYPO 30X.5 LL (NEEDLE) IMPLANT
NS IRRIG 1000ML POUR BTL (IV SOLUTION) ×2 IMPLANT
PACK VITRECTOMY CUSTOM (CUSTOM PROCEDURE TRAY) ×2 IMPLANT
PAD ARMBOARD 7.5X6 YLW CONV (MISCELLANEOUS) ×4 IMPLANT
PAK VITRECTOMY PIK  27GA (OPHTHALMIC) ×1
PAK VITRECTOMY PIK 27GA (OPHTHALMIC) ×1 IMPLANT
PENCIL BIPOLAR 25GA STR DISP (OPHTHALMIC RELATED) IMPLANT
PIC ILLUMINATED 25G (OPHTHALMIC) ×2
PICK MICROPICK 25G (MISCELLANEOUS) IMPLANT
PIK ILLUMINATED 25G (OPHTHALMIC) ×1 IMPLANT
PROBE LASER ILLUM FLEX 27GA (OPHTHALMIC) ×2 IMPLANT
PROBE LASER ILLUM FLEX CVD 25G (OPHTHALMIC) IMPLANT
REPL STRA BRUSH NEEDLE (NEEDLE) IMPLANT
RESERVOIR BACK FLUSH (MISCELLANEOUS) IMPLANT
ROLLS DENTAL (MISCELLANEOUS) ×4 IMPLANT
SCISSORS TIP ADVANCED DSP 25GA (INSTRUMENTS) IMPLANT
SCRAPER DIAMOND 25GA (OPHTHALMIC RELATED) IMPLANT
SCRAPER DIAMOND DUST MEMBRANE (MISCELLANEOUS) IMPLANT
SPONGE SURGIFOAM ABS GEL 12-7 (HEMOSTASIS) ×2 IMPLANT
STOPCOCK 4 WAY LG BORE MALE ST (IV SETS) IMPLANT
SUT CHROMIC 7 0 TG140 8 (SUTURE) IMPLANT
SUT ETHILON 10 0 CS140 6 (SUTURE) IMPLANT
SUT ETHILON 9 0 TG140 8 (SUTURE) IMPLANT
SUT POLY NON ABSORB 10-0 8 STR (SUTURE) IMPLANT
SUT SILK 4 0 RB 1 (SUTURE) IMPLANT
SYR 10ML LL (SYRINGE) IMPLANT
SYR 20CC LL (SYRINGE) ×2 IMPLANT
SYR 5ML LL (SYRINGE) IMPLANT
SYR BULB 3OZ (MISCELLANEOUS) ×2 IMPLANT
SYR TB 1ML LUER SLIP (SYRINGE) ×2 IMPLANT
TOWEL OR 17X24 6PK STRL BLUE (TOWEL DISPOSABLE) ×2 IMPLANT
TUBING HIGH PRESS EXTEN 6IN (TUBING) IMPLANT
WATER STERILE IRR 1000ML POUR (IV SOLUTION) ×2 IMPLANT
WIPE INSTRUMENT VISIWIPE 73X73 (MISCELLANEOUS) IMPLANT

## 2017-04-23 NOTE — Transfer of Care (Signed)
Immediate Anesthesia Transfer of Care Note  Patient: Kelly Blair  Procedure(s) Performed: Procedure(s): PARS PLANA VITRECTOMY 27 GAUGE (Left) PHOTOCOAGULATION WITH LASER (Left)  Patient Location: PACU  Anesthesia Type:General  Level of Consciousness: awake, alert  and oriented  Airway & Oxygen Therapy: Patient Spontanous Breathing and Patient connected to nasal cannula oxygen  Post-op Assessment: Report given to RN, Post -op Vital signs reviewed and stable and Patient moving all extremities X 4  Post vital signs: Reviewed and stable  Last Vitals:  Vitals:   04/23/17 0911  BP: (!) 174/66  Pulse: 70  Resp: 18  Temp: 36.6 C  SpO2: 100%    Last Pain:  Vitals:   04/23/17 0911  TempSrc: Oral      Patients Stated Pain Goal: 3 (69/67/89 3810)  Complications: No apparent anesthesia complications

## 2017-04-23 NOTE — Anesthesia Postprocedure Evaluation (Signed)
Anesthesia Post Note  Patient: Kelly Blair  Procedure(s) Performed: Procedure(s) (LRB): PARS PLANA VITRECTOMY 27 GAUGE (Left) PHOTOCOAGULATION WITH LASER (Left)     Patient location during evaluation: PACU Anesthesia Type: General Level of consciousness: awake and alert Pain management: pain level controlled Vital Signs Assessment: post-procedure vital signs reviewed and stable Respiratory status: spontaneous breathing, nonlabored ventilation, respiratory function stable and patient connected to nasal cannula oxygen Cardiovascular status: blood pressure returned to baseline and stable Postop Assessment: no apparent nausea or vomiting Anesthetic complications: no    Last Vitals:  Vitals:   04/23/17 1333 04/23/17 1412  BP:  (!) 157/66  Pulse:  64  Resp: 14   Temp: (!) 36.1 C 36.5 C  SpO2:  96%    Last Pain:  Vitals:   04/23/17 1412  TempSrc: Oral  PainSc:                  Tiajuana Amass

## 2017-04-23 NOTE — Anesthesia Preprocedure Evaluation (Addendum)
Anesthesia Evaluation  Patient identified by MRN, date of birth, ID band Patient awake    Reviewed: Allergy & Precautions, NPO status , Patient's Chart, lab work & pertinent test results, reviewed documented beta blocker date and time   Airway Mallampati: II  TM Distance: >3 FB Neck ROM: Full    Dental no notable dental hx. (+) Dental Advisory Given   Pulmonary neg pulmonary ROS,    Pulmonary exam normal        Cardiovascular hypertension, Pt. on home beta blockers Normal cardiovascular exam     Neuro/Psych TIAnegative psych ROS   GI/Hepatic negative GI ROS, Neg liver ROS,   Endo/Other  diabetesMorbid obesity  Renal/GU Renal InsufficiencyRenal disease  negative genitourinary   Musculoskeletal negative musculoskeletal ROS (+)   Abdominal   Peds negative pediatric ROS (+)  Hematology negative hematology ROS (+)   Anesthesia Other Findings   Reproductive/Obstetrics negative OB ROS                            Anesthesia Physical Anesthesia Plan  ASA: III  Anesthesia Plan: General   Post-op Pain Management:    Induction: Intravenous  PONV Risk Score and Plan: 3 and Ondansetron, Dexamethasone and Diphenhydramine  Airway Management Planned: Oral ETT  Additional Equipment:   Intra-op Plan:   Post-operative Plan: Extubation in OR  Informed Consent: I have reviewed the patients History and Physical, chart, labs and discussed the procedure including the risks, benefits and alternatives for the proposed anesthesia with the patient or authorized representative who has indicated his/her understanding and acceptance.   Dental advisory given and Dental Advisory Given  Plan Discussed with: CRNA and Anesthesiologist  Anesthesia Plan Comments:        Anesthesia Quick Evaluation

## 2017-04-23 NOTE — H&P (Signed)
I examined the patient today and there is no change in the medical status 

## 2017-04-23 NOTE — Brief Op Note (Signed)
Brief Operative note   Preoperative diagnosis:  vitreous hemorrhage left eye Postoperative diagnosis  * No Diagnosis Codes entered *  Procedures: Pars plana vitrectomy, membrane peel, laser, gas injection left eye  Surgeon:  Hayden Pedro, MD...  Assistant:  Deatra Ina SA    Anesthesia: General  Specimen: none  Estimated blood loss:  1cc  Complications: none  Patient sent to PACU in good condition  Composed by Hayden Pedro MD  Dictation number: [id number]

## 2017-04-23 NOTE — Anesthesia Procedure Notes (Signed)
Procedure Name: Intubation Date/Time: 04/23/2017 11:41 AM Performed by: Neldon Newport Pre-anesthesia Checklist: Timeout performed, Suction available, Patient being monitored, Emergency Drugs available and Patient identified Patient Re-evaluated:Patient Re-evaluated prior to induction Oxygen Delivery Method: Circle system utilized Preoxygenation: Pre-oxygenation with 100% oxygen Induction Type: IV induction Ventilation: Mask ventilation without difficulty and Oral airway inserted - appropriate to patient size Laryngoscope Size: Mac and 3 Grade View: Grade II Tube size: 7.0 mm Number of attempts: 2 Placement Confirmation: breath sounds checked- equal and bilateral,  positive ETCO2 and ETT inserted through vocal cords under direct vision Secured at: 21 cm Tube secured with: Tape Dental Injury: Teeth and Oropharynx as per pre-operative assessment

## 2017-04-24 ENCOUNTER — Encounter (HOSPITAL_COMMUNITY): Payer: Self-pay | Admitting: Ophthalmology

## 2017-04-24 DIAGNOSIS — I10 Essential (primary) hypertension: Secondary | ICD-10-CM | POA: Diagnosis not present

## 2017-04-24 DIAGNOSIS — Z7984 Long term (current) use of oral hypoglycemic drugs: Secondary | ICD-10-CM | POA: Diagnosis not present

## 2017-04-24 DIAGNOSIS — E7800 Pure hypercholesterolemia, unspecified: Secondary | ICD-10-CM | POA: Diagnosis not present

## 2017-04-24 DIAGNOSIS — E113592 Type 2 diabetes mellitus with proliferative diabetic retinopathy without macular edema, left eye: Secondary | ICD-10-CM | POA: Diagnosis not present

## 2017-04-24 DIAGNOSIS — Z79899 Other long term (current) drug therapy: Secondary | ICD-10-CM | POA: Diagnosis not present

## 2017-04-24 DIAGNOSIS — Z8673 Personal history of transient ischemic attack (TIA), and cerebral infarction without residual deficits: Secondary | ICD-10-CM | POA: Diagnosis not present

## 2017-04-24 DIAGNOSIS — H4312 Vitreous hemorrhage, left eye: Secondary | ICD-10-CM | POA: Diagnosis not present

## 2017-04-24 LAB — GLUCOSE, CAPILLARY: GLUCOSE-CAPILLARY: 218 mg/dL — AB (ref 65–99)

## 2017-04-24 MED ORDER — GATIFLOXACIN 0.5 % OP SOLN: 1.0000 [drp] | Freq: Four times a day (QID) | OPHTHALMIC | Status: DC

## 2017-04-24 MED ORDER — PREDNISOLONE ACETATE 1 % OP SUSP: 1.0000 [drp] | Freq: Four times a day (QID) | OPHTHALMIC | 0 refills | Status: AC

## 2017-04-24 MED ORDER — BACITRACIN-POLYMYXIN B 500-10000 UNIT/GM OP OINT: 1.0000 "application " | TOPICAL_OINTMENT | Freq: Three times a day (TID) | OPHTHALMIC | 0 refills | Status: DC

## 2017-04-24 NOTE — Progress Notes (Signed)
04/24/2017, 6:29 AM  Mental Status:  Awake, Alert, Oriented  Anterior segment: Cornea  Clear    Anterior Chamber Clear    Lens:   Cataract  Intra Ocular Pressure 18 mmHg with Tonopen  Vitreous: Clear 40%gas bubble   Retina:  Attached Good laser reaction   Impression: Excellent result Retina attached   Final Diagnosis: Active Problems:   Vitreous membranes, left   Plan: start post operative eye drops.  Discharge to home.  Give post operative instructions  Hayden Pedro 04/24/2017, 6:29 AM

## 2017-04-24 NOTE — Progress Notes (Signed)
Discharge instructions was given, discharged to home accompanied by family.

## 2017-04-24 NOTE — Op Note (Signed)
[name], Kelly Blair              ACCOUNT NO.:  [id number]  MEDICAL RECORD NO.:  [id number]  LOCATION:                                 FACILITY:  PHYSICIAN:  John D. Zigmund Daniel, M.D.      DATE OF BIRTH:  DATE OF PROCEDURE:  04/23/2017 DATE OF DISCHARGE:                              OPERATIVE REPORT   ADMISSION DIAGNOSIS:  Vitreous hemorrhage, proliferative diabetic retinopathy, left eye.  PROCEDURES:  Pars plana vitrectomy, retinal photocoagulation, and membrane peel with a 27-gauge tip.  SURGEON:  Chrystie Nose. Zigmund Daniel, M.D.  ASSISTANT:  Deatra Ina, SA.  ANESTHESIA:  General.  DETAILS:  Usual prep and drape, 27-gauge trocars placed at 10, 2, and 4 o'clock.  Infusion at 4 o'clock.  Provisc placed on the corneal surface and the BIOM viewing system was moved into place.  The pars plana vitrectomy was begun just behind the cataractous lens.  Blood was attached to the lens and this was carefully sucked and teased off the back of the lens with a 27-gauge cutter and suction.  The vitrectomy was carried down in a core fashion down to the macular surface where several areas of neovascularization were seen.  The endolaser was positioned in the eye and the areas of neovascularization were treated with laser. Additional vitrectomy was then carried out in the mid periphery, and the silicone tip suction line was used to gain access to this area and removed the surface retinal blood.  The cutter was repositioned in the eye and the vitrectomy was carried into the far periphery.  Large clots of blood and vitreous debris were encountered.  These were carefully removed under low suction and rapid cutting.  Scleral depression was used.  The super wide viewing BIOM lens was placed on the scope and super wide viewing was allowed.  The vitreous cutter was used to remove all blood from pars plana and the far periphery.  Again, scleral depression was used.  The endolaser was positioned in the  eye. Panretinal photocoagulation was performed.  1479 burns were placed around the retinal periphery.  The power was 300 mW, 1000 microns each, and 0.1 seconds each.  Additional blood was vacuumed from the surface of the retina.  At this point, a 70% gas-fluid exchange was carried out. The instruments were removed from the eye and the wounds were tested. They were found to be secure.  Polymyxin and ceftazidime were rinsed around the globe for antibiotic coverage.  Decadron 10 mg was injected into the lower subconjunctival space.  Kenalog 0.01 mL was injected into the vitreous cavity.  Atropine solution was applied.  Marcaine was injected around the globe for postop pain.  The closing pressure was 10 with a Barraquer tonometer.  COMPLICATIONS:  None.  DURATION:  One hour.  Polysporin, a patch, and shield were placed.  The patient was awakened and taken to recovery in satisfactory condition.     Chrystie Nose. Zigmund Daniel, M.D.   ______________________________ Chrystie Nose. Zigmund Daniel, M.D.    JDM/MEDQ  D:  04/23/2017  T:  04/23/2017  Job:  [id number]

## 2017-04-24 NOTE — Discharge Summary (Signed)
Discharge summary not needed on [facility] patients per medical records. 

## 2017-04-30 ENCOUNTER — Encounter (INDEPENDENT_AMBULATORY_CARE_PROVIDER_SITE_OTHER): Payer: Medicare HMO | Admitting: Ophthalmology

## 2017-04-30 DIAGNOSIS — H4312 Vitreous hemorrhage, left eye: Secondary | ICD-10-CM

## 2017-05-22 ENCOUNTER — Encounter (INDEPENDENT_AMBULATORY_CARE_PROVIDER_SITE_OTHER): Payer: Medicare HMO | Admitting: Ophthalmology

## 2017-05-22 DIAGNOSIS — E11311 Type 2 diabetes mellitus with unspecified diabetic retinopathy with macular edema: Secondary | ICD-10-CM | POA: Diagnosis not present

## 2017-05-22 DIAGNOSIS — E113513 Type 2 diabetes mellitus with proliferative diabetic retinopathy with macular edema, bilateral: Secondary | ICD-10-CM

## 2017-05-27 ENCOUNTER — Other Ambulatory Visit (HOSPITAL_COMMUNITY): Payer: Self-pay | Admitting: Family Medicine

## 2017-05-27 DIAGNOSIS — Z1231 Encounter for screening mammogram for malignant neoplasm of breast: Secondary | ICD-10-CM

## 2017-05-30 ENCOUNTER — Encounter (HOSPITAL_COMMUNITY): Payer: Self-pay

## 2017-05-30 ENCOUNTER — Ambulatory Visit (HOSPITAL_COMMUNITY)
Admission: RE | Admit: 2017-05-30 | Discharge: 2017-05-30 | Disposition: A | Discharge status: Home / Self Care | Payer: Medicare HMO | Source: Ambulatory Visit | Attending: Family Medicine | Admitting: Family Medicine

## 2017-05-30 DIAGNOSIS — Z1231 Encounter for screening mammogram for malignant neoplasm of breast: Secondary | ICD-10-CM

## 2017-06-08 DIAGNOSIS — R69 Illness, unspecified: Secondary | ICD-10-CM | POA: Diagnosis not present

## 2017-06-15 LAB — HEMOGLOBIN A1C: HEMOGLOBIN A1C: 7.8

## 2017-06-19 ENCOUNTER — Encounter (INDEPENDENT_AMBULATORY_CARE_PROVIDER_SITE_OTHER): Payer: Medicare HMO | Admitting: Ophthalmology

## 2017-07-01 DIAGNOSIS — R69 Illness, unspecified: Secondary | ICD-10-CM | POA: Diagnosis not present

## 2017-07-05 ENCOUNTER — Encounter (INDEPENDENT_AMBULATORY_CARE_PROVIDER_SITE_OTHER): Payer: Medicare HMO | Admitting: Ophthalmology

## 2017-07-05 DIAGNOSIS — E113513 Type 2 diabetes mellitus with proliferative diabetic retinopathy with macular edema, bilateral: Secondary | ICD-10-CM | POA: Diagnosis not present

## 2017-07-05 DIAGNOSIS — H35033 Hypertensive retinopathy, bilateral: Secondary | ICD-10-CM | POA: Diagnosis not present

## 2017-07-05 DIAGNOSIS — E11311 Type 2 diabetes mellitus with unspecified diabetic retinopathy with macular edema: Secondary | ICD-10-CM

## 2017-07-05 DIAGNOSIS — H43811 Vitreous degeneration, right eye: Secondary | ICD-10-CM | POA: Diagnosis not present

## 2017-07-05 DIAGNOSIS — H2513 Age-related nuclear cataract, bilateral: Secondary | ICD-10-CM | POA: Diagnosis not present

## 2017-07-05 DIAGNOSIS — I10 Essential (primary) hypertension: Secondary | ICD-10-CM | POA: Diagnosis not present

## 2017-07-05 DIAGNOSIS — H4312 Vitreous hemorrhage, left eye: Secondary | ICD-10-CM | POA: Diagnosis not present

## 2017-07-22 DIAGNOSIS — I10 Essential (primary) hypertension: Secondary | ICD-10-CM | POA: Diagnosis not present

## 2017-07-22 DIAGNOSIS — H409 Unspecified glaucoma: Secondary | ICD-10-CM | POA: Diagnosis not present

## 2017-07-22 DIAGNOSIS — Z0000 Encounter for general adult medical examination without abnormal findings: Secondary | ICD-10-CM | POA: Diagnosis not present

## 2017-07-22 DIAGNOSIS — E785 Hyperlipidemia, unspecified: Secondary | ICD-10-CM | POA: Diagnosis not present

## 2017-07-22 DIAGNOSIS — E119 Type 2 diabetes mellitus without complications: Secondary | ICD-10-CM | POA: Diagnosis not present

## 2017-07-22 DIAGNOSIS — Z79899 Other long term (current) drug therapy: Secondary | ICD-10-CM | POA: Diagnosis not present

## 2017-07-22 DIAGNOSIS — Z794 Long term (current) use of insulin: Secondary | ICD-10-CM | POA: Diagnosis not present

## 2017-07-22 DIAGNOSIS — Z6835 Body mass index (BMI) 35.0-35.9, adult: Secondary | ICD-10-CM | POA: Diagnosis not present

## 2017-07-24 ENCOUNTER — Encounter (INDEPENDENT_AMBULATORY_CARE_PROVIDER_SITE_OTHER): Payer: Medicare HMO | Admitting: Ophthalmology

## 2017-07-24 DIAGNOSIS — H43811 Vitreous degeneration, right eye: Secondary | ICD-10-CM | POA: Diagnosis not present

## 2017-07-24 DIAGNOSIS — H35033 Hypertensive retinopathy, bilateral: Secondary | ICD-10-CM

## 2017-07-24 DIAGNOSIS — I10 Essential (primary) hypertension: Secondary | ICD-10-CM

## 2017-07-24 DIAGNOSIS — H35372 Puckering of macula, left eye: Secondary | ICD-10-CM | POA: Diagnosis not present

## 2017-07-24 DIAGNOSIS — E113513 Type 2 diabetes mellitus with proliferative diabetic retinopathy with macular edema, bilateral: Secondary | ICD-10-CM

## 2017-07-24 DIAGNOSIS — H2513 Age-related nuclear cataract, bilateral: Secondary | ICD-10-CM | POA: Diagnosis not present

## 2017-07-24 DIAGNOSIS — E11311 Type 2 diabetes mellitus with unspecified diabetic retinopathy with macular edema: Secondary | ICD-10-CM | POA: Diagnosis not present

## 2017-08-05 DIAGNOSIS — N183 Chronic kidney disease, stage 3 (moderate): Secondary | ICD-10-CM | POA: Diagnosis not present

## 2017-08-07 DIAGNOSIS — R69 Illness, unspecified: Secondary | ICD-10-CM | POA: Diagnosis not present

## 2017-08-12 DIAGNOSIS — E1122 Type 2 diabetes mellitus with diabetic chronic kidney disease: Secondary | ICD-10-CM | POA: Diagnosis not present

## 2017-08-12 DIAGNOSIS — N183 Chronic kidney disease, stage 3 (moderate): Secondary | ICD-10-CM | POA: Diagnosis not present

## 2017-08-12 DIAGNOSIS — I129 Hypertensive chronic kidney disease with stage 1 through stage 4 chronic kidney disease, or unspecified chronic kidney disease: Secondary | ICD-10-CM | POA: Diagnosis not present

## 2017-08-12 DIAGNOSIS — D649 Anemia, unspecified: Secondary | ICD-10-CM | POA: Diagnosis not present

## 2017-08-12 DIAGNOSIS — R609 Edema, unspecified: Secondary | ICD-10-CM | POA: Diagnosis not present

## 2017-08-12 DIAGNOSIS — Z6835 Body mass index (BMI) 35.0-35.9, adult: Secondary | ICD-10-CM | POA: Diagnosis not present

## 2017-08-20 DIAGNOSIS — H2513 Age-related nuclear cataract, bilateral: Secondary | ICD-10-CM | POA: Diagnosis not present

## 2017-08-20 DIAGNOSIS — H2512 Age-related nuclear cataract, left eye: Secondary | ICD-10-CM | POA: Diagnosis not present

## 2017-08-20 DIAGNOSIS — H25013 Cortical age-related cataract, bilateral: Secondary | ICD-10-CM | POA: Diagnosis not present

## 2017-08-20 DIAGNOSIS — E113593 Type 2 diabetes mellitus with proliferative diabetic retinopathy without macular edema, bilateral: Secondary | ICD-10-CM | POA: Diagnosis not present

## 2017-08-20 DIAGNOSIS — E119 Type 2 diabetes mellitus without complications: Secondary | ICD-10-CM | POA: Diagnosis not present

## 2017-08-20 DIAGNOSIS — H35372 Puckering of macula, left eye: Secondary | ICD-10-CM | POA: Diagnosis not present

## 2017-08-21 ENCOUNTER — Encounter (INDEPENDENT_AMBULATORY_CARE_PROVIDER_SITE_OTHER): Payer: Medicare HMO | Admitting: Ophthalmology

## 2017-08-21 DIAGNOSIS — H35033 Hypertensive retinopathy, bilateral: Secondary | ICD-10-CM

## 2017-08-21 DIAGNOSIS — E11311 Type 2 diabetes mellitus with unspecified diabetic retinopathy with macular edema: Secondary | ICD-10-CM | POA: Diagnosis not present

## 2017-08-21 DIAGNOSIS — H43813 Vitreous degeneration, bilateral: Secondary | ICD-10-CM | POA: Diagnosis not present

## 2017-08-21 DIAGNOSIS — I10 Essential (primary) hypertension: Secondary | ICD-10-CM

## 2017-08-21 DIAGNOSIS — E113513 Type 2 diabetes mellitus with proliferative diabetic retinopathy with macular edema, bilateral: Secondary | ICD-10-CM

## 2017-09-05 DIAGNOSIS — E0829 Diabetes mellitus due to underlying condition with other diabetic kidney complication: Secondary | ICD-10-CM | POA: Diagnosis not present

## 2017-09-05 DIAGNOSIS — E0822 Diabetes mellitus due to underlying condition with diabetic chronic kidney disease: Secondary | ICD-10-CM | POA: Diagnosis not present

## 2017-09-05 DIAGNOSIS — H33333 Multiple defects of retina without detachment, bilateral: Secondary | ICD-10-CM | POA: Diagnosis not present

## 2017-09-05 DIAGNOSIS — I10 Essential (primary) hypertension: Secondary | ICD-10-CM | POA: Diagnosis not present

## 2017-09-05 DIAGNOSIS — E6609 Other obesity due to excess calories: Secondary | ICD-10-CM | POA: Diagnosis not present

## 2017-09-11 DIAGNOSIS — H2512 Age-related nuclear cataract, left eye: Secondary | ICD-10-CM | POA: Diagnosis not present

## 2017-09-11 DIAGNOSIS — H25812 Combined forms of age-related cataract, left eye: Secondary | ICD-10-CM | POA: Diagnosis not present

## 2017-09-16 ENCOUNTER — Encounter (INDEPENDENT_AMBULATORY_CARE_PROVIDER_SITE_OTHER): Payer: Medicare HMO | Admitting: Ophthalmology

## 2017-09-16 DIAGNOSIS — H35033 Hypertensive retinopathy, bilateral: Secondary | ICD-10-CM

## 2017-09-16 DIAGNOSIS — H2511 Age-related nuclear cataract, right eye: Secondary | ICD-10-CM

## 2017-09-16 DIAGNOSIS — E11311 Type 2 diabetes mellitus with unspecified diabetic retinopathy with macular edema: Secondary | ICD-10-CM | POA: Diagnosis not present

## 2017-09-16 DIAGNOSIS — E113513 Type 2 diabetes mellitus with proliferative diabetic retinopathy with macular edema, bilateral: Secondary | ICD-10-CM

## 2017-09-16 DIAGNOSIS — I10 Essential (primary) hypertension: Secondary | ICD-10-CM | POA: Diagnosis not present

## 2017-09-16 DIAGNOSIS — H35372 Puckering of macula, left eye: Secondary | ICD-10-CM | POA: Diagnosis not present

## 2017-09-16 DIAGNOSIS — H43811 Vitreous degeneration, right eye: Secondary | ICD-10-CM

## 2017-09-17 DIAGNOSIS — H25011 Cortical age-related cataract, right eye: Secondary | ICD-10-CM | POA: Diagnosis not present

## 2017-09-17 DIAGNOSIS — H2511 Age-related nuclear cataract, right eye: Secondary | ICD-10-CM | POA: Diagnosis not present

## 2017-09-17 DIAGNOSIS — D649 Anemia, unspecified: Secondary | ICD-10-CM | POA: Diagnosis not present

## 2017-09-20 DIAGNOSIS — E0829 Diabetes mellitus due to underlying condition with other diabetic kidney complication: Secondary | ICD-10-CM | POA: Diagnosis not present

## 2017-09-20 DIAGNOSIS — I10 Essential (primary) hypertension: Secondary | ICD-10-CM | POA: Diagnosis not present

## 2017-10-04 DIAGNOSIS — D649 Anemia, unspecified: Secondary | ICD-10-CM | POA: Diagnosis not present

## 2017-10-09 DIAGNOSIS — H2511 Age-related nuclear cataract, right eye: Secondary | ICD-10-CM | POA: Diagnosis not present

## 2017-10-09 DIAGNOSIS — H25811 Combined forms of age-related cataract, right eye: Secondary | ICD-10-CM | POA: Diagnosis not present

## 2017-10-14 ENCOUNTER — Encounter (INDEPENDENT_AMBULATORY_CARE_PROVIDER_SITE_OTHER): Payer: Medicare HMO | Admitting: Ophthalmology

## 2017-10-14 DIAGNOSIS — E113513 Type 2 diabetes mellitus with proliferative diabetic retinopathy with macular edema, bilateral: Secondary | ICD-10-CM

## 2017-10-14 DIAGNOSIS — H35033 Hypertensive retinopathy, bilateral: Secondary | ICD-10-CM

## 2017-10-14 DIAGNOSIS — I10 Essential (primary) hypertension: Secondary | ICD-10-CM | POA: Diagnosis not present

## 2017-10-14 DIAGNOSIS — E11311 Type 2 diabetes mellitus with unspecified diabetic retinopathy with macular edema: Secondary | ICD-10-CM

## 2017-10-14 DIAGNOSIS — H35372 Puckering of macula, left eye: Secondary | ICD-10-CM | POA: Diagnosis not present

## 2017-10-14 DIAGNOSIS — H43811 Vitreous degeneration, right eye: Secondary | ICD-10-CM | POA: Diagnosis not present

## 2017-10-22 DIAGNOSIS — R69 Illness, unspecified: Secondary | ICD-10-CM | POA: Diagnosis not present

## 2017-10-22 DIAGNOSIS — E0829 Diabetes mellitus due to underlying condition with other diabetic kidney complication: Secondary | ICD-10-CM | POA: Diagnosis not present

## 2017-10-22 DIAGNOSIS — H33333 Multiple defects of retina without detachment, bilateral: Secondary | ICD-10-CM | POA: Diagnosis not present

## 2017-10-22 DIAGNOSIS — I10 Essential (primary) hypertension: Secondary | ICD-10-CM | POA: Diagnosis not present

## 2017-11-11 ENCOUNTER — Encounter (INDEPENDENT_AMBULATORY_CARE_PROVIDER_SITE_OTHER): Payer: Medicare HMO | Admitting: Ophthalmology

## 2017-11-21 DIAGNOSIS — E0829 Diabetes mellitus due to underlying condition with other diabetic kidney complication: Secondary | ICD-10-CM | POA: Diagnosis not present

## 2017-11-21 DIAGNOSIS — H33339 Multiple defects of retina without detachment, unspecified eye: Secondary | ICD-10-CM | POA: Diagnosis not present

## 2017-11-21 DIAGNOSIS — H33333 Multiple defects of retina without detachment, bilateral: Secondary | ICD-10-CM | POA: Diagnosis not present

## 2017-11-21 DIAGNOSIS — E6609 Other obesity due to excess calories: Secondary | ICD-10-CM | POA: Diagnosis not present

## 2017-11-21 DIAGNOSIS — E0822 Diabetes mellitus due to underlying condition with diabetic chronic kidney disease: Secondary | ICD-10-CM | POA: Diagnosis not present

## 2018-01-02 DIAGNOSIS — R69 Illness, unspecified: Secondary | ICD-10-CM | POA: Diagnosis not present

## 2018-01-21 DIAGNOSIS — E0822 Diabetes mellitus due to underlying condition with diabetic chronic kidney disease: Secondary | ICD-10-CM | POA: Diagnosis not present

## 2018-01-21 DIAGNOSIS — I10 Essential (primary) hypertension: Secondary | ICD-10-CM | POA: Diagnosis not present

## 2018-01-23 DIAGNOSIS — Z0000 Encounter for general adult medical examination without abnormal findings: Secondary | ICD-10-CM | POA: Diagnosis not present

## 2018-01-27 DIAGNOSIS — R69 Illness, unspecified: Secondary | ICD-10-CM | POA: Diagnosis not present

## 2018-02-04 DIAGNOSIS — D649 Anemia, unspecified: Secondary | ICD-10-CM | POA: Diagnosis not present

## 2018-02-04 DIAGNOSIS — N2581 Secondary hyperparathyroidism of renal origin: Secondary | ICD-10-CM | POA: Diagnosis not present

## 2018-02-04 DIAGNOSIS — D509 Iron deficiency anemia, unspecified: Secondary | ICD-10-CM | POA: Diagnosis not present

## 2018-02-04 DIAGNOSIS — I129 Hypertensive chronic kidney disease with stage 1 through stage 4 chronic kidney disease, or unspecified chronic kidney disease: Secondary | ICD-10-CM | POA: Diagnosis not present

## 2018-02-04 DIAGNOSIS — N183 Chronic kidney disease, stage 3 (moderate): Secondary | ICD-10-CM | POA: Diagnosis not present

## 2018-02-04 DIAGNOSIS — R609 Edema, unspecified: Secondary | ICD-10-CM | POA: Diagnosis not present

## 2018-02-04 DIAGNOSIS — E1122 Type 2 diabetes mellitus with diabetic chronic kidney disease: Secondary | ICD-10-CM | POA: Diagnosis not present

## 2018-02-10 DIAGNOSIS — E113511 Type 2 diabetes mellitus with proliferative diabetic retinopathy with macular edema, right eye: Secondary | ICD-10-CM | POA: Diagnosis not present

## 2018-02-10 DIAGNOSIS — E113512 Type 2 diabetes mellitus with proliferative diabetic retinopathy with macular edema, left eye: Secondary | ICD-10-CM | POA: Diagnosis not present

## 2018-02-19 DIAGNOSIS — R69 Illness, unspecified: Secondary | ICD-10-CM | POA: Diagnosis not present

## 2018-02-27 DIAGNOSIS — N183 Chronic kidney disease, stage 3 (moderate): Secondary | ICD-10-CM | POA: Diagnosis not present

## 2018-03-05 DIAGNOSIS — I10 Essential (primary) hypertension: Secondary | ICD-10-CM | POA: Diagnosis not present

## 2018-03-05 DIAGNOSIS — Z7722 Contact with and (suspected) exposure to environmental tobacco smoke (acute) (chronic): Secondary | ICD-10-CM | POA: Diagnosis not present

## 2018-03-05 DIAGNOSIS — Z833 Family history of diabetes mellitus: Secondary | ICD-10-CM | POA: Diagnosis not present

## 2018-03-05 DIAGNOSIS — E669 Obesity, unspecified: Secondary | ICD-10-CM | POA: Diagnosis not present

## 2018-03-05 DIAGNOSIS — Z794 Long term (current) use of insulin: Secondary | ICD-10-CM | POA: Diagnosis not present

## 2018-03-05 DIAGNOSIS — Z809 Family history of malignant neoplasm, unspecified: Secondary | ICD-10-CM | POA: Diagnosis not present

## 2018-03-05 DIAGNOSIS — Z6838 Body mass index (BMI) 38.0-38.9, adult: Secondary | ICD-10-CM | POA: Diagnosis not present

## 2018-03-05 DIAGNOSIS — E785 Hyperlipidemia, unspecified: Secondary | ICD-10-CM | POA: Diagnosis not present

## 2018-03-05 DIAGNOSIS — E119 Type 2 diabetes mellitus without complications: Secondary | ICD-10-CM | POA: Diagnosis not present

## 2018-03-05 DIAGNOSIS — Z8249 Family history of ischemic heart disease and other diseases of the circulatory system: Secondary | ICD-10-CM | POA: Diagnosis not present

## 2018-03-24 DIAGNOSIS — E113512 Type 2 diabetes mellitus with proliferative diabetic retinopathy with macular edema, left eye: Secondary | ICD-10-CM | POA: Diagnosis not present

## 2018-03-24 DIAGNOSIS — E113511 Type 2 diabetes mellitus with proliferative diabetic retinopathy with macular edema, right eye: Secondary | ICD-10-CM | POA: Diagnosis not present

## 2018-03-24 DIAGNOSIS — Z961 Presence of intraocular lens: Secondary | ICD-10-CM | POA: Diagnosis not present

## 2018-04-01 DIAGNOSIS — H409 Unspecified glaucoma: Secondary | ICD-10-CM | POA: Diagnosis not present

## 2018-04-01 DIAGNOSIS — E119 Type 2 diabetes mellitus without complications: Secondary | ICD-10-CM | POA: Diagnosis not present

## 2018-04-01 DIAGNOSIS — Z6835 Body mass index (BMI) 35.0-35.9, adult: Secondary | ICD-10-CM | POA: Diagnosis not present

## 2018-04-01 DIAGNOSIS — H33333 Multiple defects of retina without detachment, bilateral: Secondary | ICD-10-CM | POA: Diagnosis not present

## 2018-04-01 DIAGNOSIS — I10 Essential (primary) hypertension: Secondary | ICD-10-CM | POA: Diagnosis not present

## 2018-04-01 DIAGNOSIS — E669 Obesity, unspecified: Secondary | ICD-10-CM | POA: Diagnosis not present

## 2018-04-04 DIAGNOSIS — R69 Illness, unspecified: Secondary | ICD-10-CM | POA: Diagnosis not present

## 2018-04-21 ENCOUNTER — Encounter (INDEPENDENT_AMBULATORY_CARE_PROVIDER_SITE_OTHER): Payer: Medicare HMO | Admitting: Ophthalmology

## 2018-04-22 ENCOUNTER — Other Ambulatory Visit (HOSPITAL_COMMUNITY): Payer: Self-pay | Admitting: Family Medicine

## 2018-04-22 DIAGNOSIS — Z1231 Encounter for screening mammogram for malignant neoplasm of breast: Secondary | ICD-10-CM

## 2018-04-26 DIAGNOSIS — R69 Illness, unspecified: Secondary | ICD-10-CM | POA: Diagnosis not present

## 2018-05-20 DIAGNOSIS — R69 Illness, unspecified: Secondary | ICD-10-CM | POA: Diagnosis not present

## 2018-06-04 ENCOUNTER — Ambulatory Visit (HOSPITAL_COMMUNITY)
Admission: RE | Admit: 2018-06-04 | Discharge: 2018-06-04 | Disposition: A | Discharge status: Home / Self Care | Payer: Medicare HMO | Source: Ambulatory Visit | Attending: Family Medicine | Admitting: Family Medicine

## 2018-06-04 DIAGNOSIS — Z1231 Encounter for screening mammogram for malignant neoplasm of breast: Secondary | ICD-10-CM | POA: Diagnosis not present

## 2018-06-09 DIAGNOSIS — R69 Illness, unspecified: Secondary | ICD-10-CM | POA: Diagnosis not present

## 2018-07-16 DIAGNOSIS — R69 Illness, unspecified: Secondary | ICD-10-CM | POA: Diagnosis not present

## 2018-07-22 DIAGNOSIS — I10 Essential (primary) hypertension: Secondary | ICD-10-CM | POA: Diagnosis not present

## 2018-07-22 DIAGNOSIS — E118 Type 2 diabetes mellitus with unspecified complications: Secondary | ICD-10-CM | POA: Diagnosis not present

## 2018-07-22 DIAGNOSIS — E0829 Diabetes mellitus due to underlying condition with other diabetic kidney complication: Secondary | ICD-10-CM | POA: Diagnosis not present

## 2018-07-22 DIAGNOSIS — Z6834 Body mass index (BMI) 34.0-34.9, adult: Secondary | ICD-10-CM | POA: Diagnosis not present

## 2018-07-22 DIAGNOSIS — L0232 Furuncle of buttock: Secondary | ICD-10-CM | POA: Diagnosis not present

## 2018-08-15 DIAGNOSIS — R69 Illness, unspecified: Secondary | ICD-10-CM | POA: Diagnosis not present

## 2018-08-21 DIAGNOSIS — D649 Anemia, unspecified: Secondary | ICD-10-CM | POA: Diagnosis not present

## 2018-08-21 DIAGNOSIS — N2581 Secondary hyperparathyroidism of renal origin: Secondary | ICD-10-CM | POA: Diagnosis not present

## 2018-08-21 DIAGNOSIS — N183 Chronic kidney disease, stage 3 (moderate): Secondary | ICD-10-CM | POA: Diagnosis not present

## 2018-09-08 DIAGNOSIS — E559 Vitamin D deficiency, unspecified: Secondary | ICD-10-CM | POA: Diagnosis not present

## 2018-09-08 DIAGNOSIS — R609 Edema, unspecified: Secondary | ICD-10-CM | POA: Diagnosis not present

## 2018-09-08 DIAGNOSIS — I129 Hypertensive chronic kidney disease with stage 1 through stage 4 chronic kidney disease, or unspecified chronic kidney disease: Secondary | ICD-10-CM | POA: Diagnosis not present

## 2018-09-08 DIAGNOSIS — N183 Chronic kidney disease, stage 3 (moderate): Secondary | ICD-10-CM | POA: Diagnosis not present

## 2018-09-08 DIAGNOSIS — E1122 Type 2 diabetes mellitus with diabetic chronic kidney disease: Secondary | ICD-10-CM | POA: Diagnosis not present

## 2018-09-08 DIAGNOSIS — D509 Iron deficiency anemia, unspecified: Secondary | ICD-10-CM | POA: Diagnosis not present

## 2018-09-16 DIAGNOSIS — R69 Illness, unspecified: Secondary | ICD-10-CM | POA: Diagnosis not present

## 2018-10-16 DIAGNOSIS — R69 Illness, unspecified: Secondary | ICD-10-CM | POA: Diagnosis not present

## 2018-11-04 DIAGNOSIS — Z0000 Encounter for general adult medical examination without abnormal findings: Secondary | ICD-10-CM | POA: Diagnosis not present

## 2018-11-04 DIAGNOSIS — E785 Hyperlipidemia, unspecified: Secondary | ICD-10-CM | POA: Diagnosis not present

## 2018-11-04 DIAGNOSIS — Z8673 Personal history of transient ischemic attack (TIA), and cerebral infarction without residual deficits: Secondary | ICD-10-CM | POA: Diagnosis not present

## 2018-11-04 DIAGNOSIS — I10 Essential (primary) hypertension: Secondary | ICD-10-CM | POA: Diagnosis not present

## 2018-11-04 DIAGNOSIS — D509 Iron deficiency anemia, unspecified: Secondary | ICD-10-CM | POA: Diagnosis not present

## 2018-11-04 DIAGNOSIS — M1990 Unspecified osteoarthritis, unspecified site: Secondary | ICD-10-CM | POA: Diagnosis not present

## 2018-11-04 DIAGNOSIS — N183 Chronic kidney disease, stage 3 (moderate): Secondary | ICD-10-CM | POA: Diagnosis not present

## 2018-11-04 DIAGNOSIS — R69 Illness, unspecified: Secondary | ICD-10-CM | POA: Diagnosis not present

## 2018-11-04 DIAGNOSIS — E1169 Type 2 diabetes mellitus with other specified complication: Secondary | ICD-10-CM | POA: Diagnosis not present

## 2019-01-27 ENCOUNTER — Other Ambulatory Visit: Payer: Medicare HMO

## 2019-01-27 ENCOUNTER — Other Ambulatory Visit: Payer: Self-pay

## 2019-01-27 DIAGNOSIS — Z20822 Contact with and (suspected) exposure to covid-19: Secondary | ICD-10-CM

## 2019-01-30 LAB — NOVEL CORONAVIRUS, NAA: SARS-CoV-2, NAA: NOT DETECTED

## 2019-02-16 ENCOUNTER — Encounter (INDEPENDENT_AMBULATORY_CARE_PROVIDER_SITE_OTHER): Payer: Medicare (Managed Care) | Admitting: Ophthalmology

## 2019-02-16 ENCOUNTER — Other Ambulatory Visit: Payer: Self-pay

## 2019-02-16 DIAGNOSIS — E113513 Type 2 diabetes mellitus with proliferative diabetic retinopathy with macular edema, bilateral: Secondary | ICD-10-CM

## 2019-02-16 DIAGNOSIS — I10 Essential (primary) hypertension: Secondary | ICD-10-CM | POA: Diagnosis not present

## 2019-02-16 DIAGNOSIS — H35033 Hypertensive retinopathy, bilateral: Secondary | ICD-10-CM | POA: Diagnosis not present

## 2019-02-16 DIAGNOSIS — E11311 Type 2 diabetes mellitus with unspecified diabetic retinopathy with macular edema: Secondary | ICD-10-CM | POA: Diagnosis not present

## 2019-02-16 DIAGNOSIS — H43813 Vitreous degeneration, bilateral: Secondary | ICD-10-CM

## 2019-03-16 ENCOUNTER — Other Ambulatory Visit: Payer: Self-pay

## 2019-03-16 ENCOUNTER — Encounter (INDEPENDENT_AMBULATORY_CARE_PROVIDER_SITE_OTHER): Payer: Medicare (Managed Care) | Admitting: Ophthalmology

## 2019-03-16 DIAGNOSIS — H26493 Other secondary cataract, bilateral: Secondary | ICD-10-CM

## 2019-03-16 DIAGNOSIS — H43813 Vitreous degeneration, bilateral: Secondary | ICD-10-CM

## 2019-03-16 DIAGNOSIS — I10 Essential (primary) hypertension: Secondary | ICD-10-CM

## 2019-03-16 DIAGNOSIS — E11311 Type 2 diabetes mellitus with unspecified diabetic retinopathy with macular edema: Secondary | ICD-10-CM

## 2019-03-16 DIAGNOSIS — E113513 Type 2 diabetes mellitus with proliferative diabetic retinopathy with macular edema, bilateral: Secondary | ICD-10-CM

## 2019-03-16 DIAGNOSIS — H35033 Hypertensive retinopathy, bilateral: Secondary | ICD-10-CM | POA: Diagnosis not present

## 2019-03-30 ENCOUNTER — Encounter (INDEPENDENT_AMBULATORY_CARE_PROVIDER_SITE_OTHER): Payer: Medicare (Managed Care) | Admitting: Ophthalmology

## 2019-03-30 ENCOUNTER — Other Ambulatory Visit: Payer: Self-pay

## 2019-03-30 DIAGNOSIS — H26492 Other secondary cataract, left eye: Secondary | ICD-10-CM

## 2019-04-14 ENCOUNTER — Encounter (INDEPENDENT_AMBULATORY_CARE_PROVIDER_SITE_OTHER): Payer: Medicare (Managed Care) | Admitting: Ophthalmology

## 2019-04-16 ENCOUNTER — Other Ambulatory Visit: Payer: Self-pay

## 2019-04-16 ENCOUNTER — Encounter (INDEPENDENT_AMBULATORY_CARE_PROVIDER_SITE_OTHER): Payer: Medicare (Managed Care) | Admitting: Ophthalmology

## 2019-04-16 DIAGNOSIS — E11311 Type 2 diabetes mellitus with unspecified diabetic retinopathy with macular edema: Secondary | ICD-10-CM

## 2019-04-16 DIAGNOSIS — H35033 Hypertensive retinopathy, bilateral: Secondary | ICD-10-CM

## 2019-04-16 DIAGNOSIS — E113513 Type 2 diabetes mellitus with proliferative diabetic retinopathy with macular edema, bilateral: Secondary | ICD-10-CM

## 2019-04-16 DIAGNOSIS — I10 Essential (primary) hypertension: Secondary | ICD-10-CM

## 2019-04-16 DIAGNOSIS — H43811 Vitreous degeneration, right eye: Secondary | ICD-10-CM

## 2019-04-16 DIAGNOSIS — H35372 Puckering of macula, left eye: Secondary | ICD-10-CM

## 2019-05-06 ENCOUNTER — Other Ambulatory Visit (HOSPITAL_COMMUNITY): Payer: Self-pay | Admitting: Gerontology

## 2019-05-06 DIAGNOSIS — Z1231 Encounter for screening mammogram for malignant neoplasm of breast: Secondary | ICD-10-CM

## 2019-05-14 ENCOUNTER — Encounter (INDEPENDENT_AMBULATORY_CARE_PROVIDER_SITE_OTHER): Payer: Medicare (Managed Care) | Admitting: Ophthalmology

## 2019-05-14 DIAGNOSIS — E11311 Type 2 diabetes mellitus with unspecified diabetic retinopathy with macular edema: Secondary | ICD-10-CM | POA: Diagnosis not present

## 2019-05-14 DIAGNOSIS — I10 Essential (primary) hypertension: Secondary | ICD-10-CM

## 2019-05-14 DIAGNOSIS — E113513 Type 2 diabetes mellitus with proliferative diabetic retinopathy with macular edema, bilateral: Secondary | ICD-10-CM | POA: Diagnosis not present

## 2019-05-14 DIAGNOSIS — H35033 Hypertensive retinopathy, bilateral: Secondary | ICD-10-CM | POA: Diagnosis not present

## 2019-05-14 DIAGNOSIS — H43813 Vitreous degeneration, bilateral: Secondary | ICD-10-CM

## 2019-05-25 ENCOUNTER — Other Ambulatory Visit: Payer: Self-pay | Admitting: Gerontology

## 2019-05-25 ENCOUNTER — Other Ambulatory Visit (HOSPITAL_COMMUNITY): Payer: Self-pay | Admitting: Gerontology

## 2019-05-25 DIAGNOSIS — Z780 Asymptomatic menopausal state: Secondary | ICD-10-CM

## 2019-05-27 ENCOUNTER — Encounter (INDEPENDENT_AMBULATORY_CARE_PROVIDER_SITE_OTHER): Payer: Medicare (Managed Care) | Admitting: Ophthalmology

## 2019-05-27 DIAGNOSIS — H26491 Other secondary cataract, right eye: Secondary | ICD-10-CM | POA: Diagnosis not present

## 2019-06-08 ENCOUNTER — Ambulatory Visit (HOSPITAL_COMMUNITY)
Admission: RE | Admit: 2019-06-08 | Discharge: 2019-06-08 | Disposition: A | Discharge status: Home / Self Care | Payer: Medicare (Managed Care) | Source: Ambulatory Visit | Attending: Gerontology | Admitting: Gerontology

## 2019-06-08 ENCOUNTER — Other Ambulatory Visit: Payer: Self-pay

## 2019-06-08 DIAGNOSIS — Z780 Asymptomatic menopausal state: Secondary | ICD-10-CM | POA: Diagnosis not present

## 2019-06-08 DIAGNOSIS — Z1231 Encounter for screening mammogram for malignant neoplasm of breast: Secondary | ICD-10-CM | POA: Insufficient documentation

## 2019-06-11 ENCOUNTER — Encounter (INDEPENDENT_AMBULATORY_CARE_PROVIDER_SITE_OTHER): Payer: Medicare (Managed Care) | Admitting: Ophthalmology

## 2019-06-18 ENCOUNTER — Encounter (INDEPENDENT_AMBULATORY_CARE_PROVIDER_SITE_OTHER): Payer: Medicare (Managed Care) | Admitting: Ophthalmology

## 2019-06-22 ENCOUNTER — Other Ambulatory Visit: Payer: Self-pay | Admitting: Cardiology

## 2019-06-22 DIAGNOSIS — Z20822 Contact with and (suspected) exposure to covid-19: Secondary | ICD-10-CM

## 2019-06-24 LAB — NOVEL CORONAVIRUS, NAA: SARS-CoV-2, NAA: NOT DETECTED

## 2019-06-25 ENCOUNTER — Telehealth: Payer: Self-pay | Admitting: General Practice

## 2019-06-25 NOTE — Telephone Encounter (Signed)
Negative COVID results given. Patient results "NOT Detected." Caller expressed understanding. ° °

## 2019-06-29 ENCOUNTER — Encounter (INDEPENDENT_AMBULATORY_CARE_PROVIDER_SITE_OTHER): Payer: Medicare (Managed Care) | Admitting: Ophthalmology

## 2019-06-29 ENCOUNTER — Other Ambulatory Visit: Payer: Self-pay

## 2019-06-29 DIAGNOSIS — E113513 Type 2 diabetes mellitus with proliferative diabetic retinopathy with macular edema, bilateral: Secondary | ICD-10-CM | POA: Diagnosis not present

## 2019-06-29 DIAGNOSIS — H35033 Hypertensive retinopathy, bilateral: Secondary | ICD-10-CM

## 2019-06-29 DIAGNOSIS — E11311 Type 2 diabetes mellitus with unspecified diabetic retinopathy with macular edema: Secondary | ICD-10-CM | POA: Diagnosis not present

## 2019-06-29 DIAGNOSIS — H43813 Vitreous degeneration, bilateral: Secondary | ICD-10-CM

## 2019-06-29 DIAGNOSIS — I10 Essential (primary) hypertension: Secondary | ICD-10-CM

## 2019-07-29 ENCOUNTER — Encounter (INDEPENDENT_AMBULATORY_CARE_PROVIDER_SITE_OTHER): Payer: Medicare (Managed Care) | Admitting: Ophthalmology

## 2019-07-29 DIAGNOSIS — H43813 Vitreous degeneration, bilateral: Secondary | ICD-10-CM

## 2019-07-29 DIAGNOSIS — H35033 Hypertensive retinopathy, bilateral: Secondary | ICD-10-CM

## 2019-07-29 DIAGNOSIS — E113513 Type 2 diabetes mellitus with proliferative diabetic retinopathy with macular edema, bilateral: Secondary | ICD-10-CM

## 2019-07-29 DIAGNOSIS — E11311 Type 2 diabetes mellitus with unspecified diabetic retinopathy with macular edema: Secondary | ICD-10-CM | POA: Diagnosis not present

## 2019-07-29 DIAGNOSIS — I10 Essential (primary) hypertension: Secondary | ICD-10-CM | POA: Diagnosis not present

## 2019-08-26 ENCOUNTER — Encounter (INDEPENDENT_AMBULATORY_CARE_PROVIDER_SITE_OTHER): Payer: Medicare (Managed Care) | Admitting: Ophthalmology

## 2019-08-26 DIAGNOSIS — H43813 Vitreous degeneration, bilateral: Secondary | ICD-10-CM

## 2019-08-26 DIAGNOSIS — I10 Essential (primary) hypertension: Secondary | ICD-10-CM

## 2019-08-26 DIAGNOSIS — E113513 Type 2 diabetes mellitus with proliferative diabetic retinopathy with macular edema, bilateral: Secondary | ICD-10-CM

## 2019-08-26 DIAGNOSIS — E11311 Type 2 diabetes mellitus with unspecified diabetic retinopathy with macular edema: Secondary | ICD-10-CM

## 2019-08-26 DIAGNOSIS — H35033 Hypertensive retinopathy, bilateral: Secondary | ICD-10-CM

## 2019-09-13 ENCOUNTER — Other Ambulatory Visit: Payer: Self-pay

## 2019-09-13 ENCOUNTER — Ambulatory Visit: Payer: Medicare (Managed Care) | Attending: Internal Medicine

## 2019-09-13 DIAGNOSIS — Z23 Encounter for immunization: Secondary | ICD-10-CM | POA: Insufficient documentation

## 2019-09-13 NOTE — Progress Notes (Signed)
   Covid-19 Vaccination Clinic  Name:  Kelly Blair    MRN: [id number] DOB: [birth year]  09/13/2019  Kelly Blair was observed post Covid-19 immunization for 15 minutes without incidence. She was provided with Vaccine Information Sheet and instruction to access the V-Safe system.   Kelly Blair was instructed to call 911 with any severe reactions post vaccine: Marland Kitchen Difficulty breathing  . Swelling of your face and throat  . A fast heartbeat  . A bad rash all over your body  . Dizziness and weakness    Immunizations Administered    Name Date Dose VIS Date Route   Moderna COVID-19 Vaccine 09/13/2019  2:31 PM 0.5 mL 07/07/2019 Intramuscular   Manufacturer: Moderna   Lot: [id number]   [place]: [id number]

## 2019-09-23 ENCOUNTER — Encounter (INDEPENDENT_AMBULATORY_CARE_PROVIDER_SITE_OTHER): Payer: Medicare (Managed Care) | Admitting: Ophthalmology

## 2019-09-28 ENCOUNTER — Encounter (INDEPENDENT_AMBULATORY_CARE_PROVIDER_SITE_OTHER): Payer: Medicare (Managed Care) | Admitting: Ophthalmology

## 2019-09-28 DIAGNOSIS — E113513 Type 2 diabetes mellitus with proliferative diabetic retinopathy with macular edema, bilateral: Secondary | ICD-10-CM | POA: Diagnosis not present

## 2019-09-28 DIAGNOSIS — I10 Essential (primary) hypertension: Secondary | ICD-10-CM | POA: Diagnosis not present

## 2019-09-28 DIAGNOSIS — H35033 Hypertensive retinopathy, bilateral: Secondary | ICD-10-CM | POA: Diagnosis not present

## 2019-09-28 DIAGNOSIS — E11311 Type 2 diabetes mellitus with unspecified diabetic retinopathy with macular edema: Secondary | ICD-10-CM

## 2019-09-28 DIAGNOSIS — H43813 Vitreous degeneration, bilateral: Secondary | ICD-10-CM

## 2019-10-14 ENCOUNTER — Ambulatory Visit: Payer: Medicare (Managed Care) | Attending: Internal Medicine

## 2019-10-14 ENCOUNTER — Other Ambulatory Visit: Payer: Self-pay

## 2019-10-14 DIAGNOSIS — Z23 Encounter for immunization: Secondary | ICD-10-CM | POA: Insufficient documentation

## 2019-10-14 NOTE — Progress Notes (Signed)
   Covid-19 Vaccination Clinic  Name:  Kelly Blair    MRN: [id number] DOB: 1950-11-10  10/14/2019  Kelly Blair was observed post Covid-19 immunization for 15 minutes without incident. She was provided with Vaccine Information Sheet and instruction to access the V-Safe system.   Kelly Blair was instructed to call 911 with any severe reactions post vaccine: Marland Kitchen Difficulty breathing  . Swelling of face and throat  . A fast heartbeat  . A bad rash all over body  . Dizziness and weakness   Immunizations Administered    Name Date Dose VIS Date Route   Moderna COVID-19 Vaccine 10/14/2019 12:59 PM 0.5 mL 07/07/2019 Intramuscular   Manufacturer: Moderna   Lot: [id number]   [place]: [phone]

## 2019-10-26 ENCOUNTER — Encounter (INDEPENDENT_AMBULATORY_CARE_PROVIDER_SITE_OTHER): Payer: Medicare (Managed Care) | Admitting: Ophthalmology

## 2019-10-26 DIAGNOSIS — H35033 Hypertensive retinopathy, bilateral: Secondary | ICD-10-CM

## 2019-10-26 DIAGNOSIS — H43813 Vitreous degeneration, bilateral: Secondary | ICD-10-CM

## 2019-10-26 DIAGNOSIS — E113513 Type 2 diabetes mellitus with proliferative diabetic retinopathy with macular edema, bilateral: Secondary | ICD-10-CM

## 2019-10-26 DIAGNOSIS — E11311 Type 2 diabetes mellitus with unspecified diabetic retinopathy with macular edema: Secondary | ICD-10-CM

## 2019-10-26 DIAGNOSIS — I10 Essential (primary) hypertension: Secondary | ICD-10-CM | POA: Diagnosis not present

## 2019-11-23 ENCOUNTER — Encounter (INDEPENDENT_AMBULATORY_CARE_PROVIDER_SITE_OTHER): Payer: Medicare (Managed Care) | Admitting: Ophthalmology

## 2019-11-23 ENCOUNTER — Other Ambulatory Visit: Payer: Self-pay

## 2019-11-23 DIAGNOSIS — H35033 Hypertensive retinopathy, bilateral: Secondary | ICD-10-CM

## 2019-11-23 DIAGNOSIS — E11311 Type 2 diabetes mellitus with unspecified diabetic retinopathy with macular edema: Secondary | ICD-10-CM | POA: Diagnosis not present

## 2019-11-23 DIAGNOSIS — H43813 Vitreous degeneration, bilateral: Secondary | ICD-10-CM

## 2019-11-23 DIAGNOSIS — I10 Essential (primary) hypertension: Secondary | ICD-10-CM

## 2019-11-23 DIAGNOSIS — E113513 Type 2 diabetes mellitus with proliferative diabetic retinopathy with macular edema, bilateral: Secondary | ICD-10-CM

## 2019-12-21 ENCOUNTER — Other Ambulatory Visit: Payer: Self-pay

## 2019-12-21 ENCOUNTER — Encounter (INDEPENDENT_AMBULATORY_CARE_PROVIDER_SITE_OTHER): Payer: Medicare (Managed Care) | Admitting: Ophthalmology

## 2019-12-21 DIAGNOSIS — E113313 Type 2 diabetes mellitus with moderate nonproliferative diabetic retinopathy with macular edema, bilateral: Secondary | ICD-10-CM | POA: Diagnosis not present

## 2019-12-21 DIAGNOSIS — H35033 Hypertensive retinopathy, bilateral: Secondary | ICD-10-CM

## 2019-12-21 DIAGNOSIS — H43813 Vitreous degeneration, bilateral: Secondary | ICD-10-CM

## 2019-12-21 DIAGNOSIS — E11311 Type 2 diabetes mellitus with unspecified diabetic retinopathy with macular edema: Secondary | ICD-10-CM

## 2019-12-21 DIAGNOSIS — I10 Essential (primary) hypertension: Secondary | ICD-10-CM | POA: Diagnosis not present

## 2020-01-18 ENCOUNTER — Other Ambulatory Visit: Payer: Self-pay

## 2020-01-18 ENCOUNTER — Encounter (INDEPENDENT_AMBULATORY_CARE_PROVIDER_SITE_OTHER): Payer: Medicare (Managed Care) | Admitting: Ophthalmology

## 2020-01-18 DIAGNOSIS — H35033 Hypertensive retinopathy, bilateral: Secondary | ICD-10-CM | POA: Diagnosis not present

## 2020-01-18 DIAGNOSIS — I10 Essential (primary) hypertension: Secondary | ICD-10-CM | POA: Diagnosis not present

## 2020-01-18 DIAGNOSIS — E11311 Type 2 diabetes mellitus with unspecified diabetic retinopathy with macular edema: Secondary | ICD-10-CM | POA: Diagnosis not present

## 2020-01-18 DIAGNOSIS — E113513 Type 2 diabetes mellitus with proliferative diabetic retinopathy with macular edema, bilateral: Secondary | ICD-10-CM | POA: Diagnosis not present

## 2020-01-18 DIAGNOSIS — H43813 Vitreous degeneration, bilateral: Secondary | ICD-10-CM

## 2020-02-15 ENCOUNTER — Encounter (INDEPENDENT_AMBULATORY_CARE_PROVIDER_SITE_OTHER): Payer: Medicare (Managed Care) | Admitting: Ophthalmology

## 2020-02-15 ENCOUNTER — Other Ambulatory Visit: Payer: Self-pay

## 2020-02-15 DIAGNOSIS — E113513 Type 2 diabetes mellitus with proliferative diabetic retinopathy with macular edema, bilateral: Secondary | ICD-10-CM | POA: Diagnosis not present

## 2020-02-15 DIAGNOSIS — E11311 Type 2 diabetes mellitus with unspecified diabetic retinopathy with macular edema: Secondary | ICD-10-CM | POA: Diagnosis not present

## 2020-02-15 DIAGNOSIS — I10 Essential (primary) hypertension: Secondary | ICD-10-CM | POA: Diagnosis not present

## 2020-02-15 DIAGNOSIS — H35033 Hypertensive retinopathy, bilateral: Secondary | ICD-10-CM | POA: Diagnosis not present

## 2020-02-15 DIAGNOSIS — H43813 Vitreous degeneration, bilateral: Secondary | ICD-10-CM

## 2020-03-14 ENCOUNTER — Other Ambulatory Visit: Payer: Self-pay

## 2020-03-14 ENCOUNTER — Encounter (INDEPENDENT_AMBULATORY_CARE_PROVIDER_SITE_OTHER): Payer: Medicare (Managed Care) | Admitting: Ophthalmology

## 2020-03-14 DIAGNOSIS — E113513 Type 2 diabetes mellitus with proliferative diabetic retinopathy with macular edema, bilateral: Secondary | ICD-10-CM

## 2020-03-14 DIAGNOSIS — H35033 Hypertensive retinopathy, bilateral: Secondary | ICD-10-CM | POA: Diagnosis not present

## 2020-03-14 DIAGNOSIS — I10 Essential (primary) hypertension: Secondary | ICD-10-CM

## 2020-03-14 DIAGNOSIS — H4311 Vitreous hemorrhage, right eye: Secondary | ICD-10-CM

## 2020-03-14 DIAGNOSIS — E11311 Type 2 diabetes mellitus with unspecified diabetic retinopathy with macular edema: Secondary | ICD-10-CM | POA: Diagnosis not present

## 2020-03-14 DIAGNOSIS — H43813 Vitreous degeneration, bilateral: Secondary | ICD-10-CM

## 2020-03-16 LAB — HEMOGLOBIN A1C: Hemoglobin A1C: 9.3

## 2020-03-21 ENCOUNTER — Encounter: Payer: Self-pay | Admitting: Endocrinology

## 2020-03-28 ENCOUNTER — Ambulatory Visit: Payer: Medicare (Managed Care) | Admitting: Podiatry

## 2020-04-12 ENCOUNTER — Encounter (INDEPENDENT_AMBULATORY_CARE_PROVIDER_SITE_OTHER): Payer: Medicare (Managed Care) | Admitting: Ophthalmology

## 2020-04-18 ENCOUNTER — Ambulatory Visit: Payer: Medicare (Managed Care) | Admitting: Podiatry

## 2020-04-22 ENCOUNTER — Ambulatory Visit: Payer: Medicare (Managed Care) | Admitting: Podiatry

## 2020-04-25 ENCOUNTER — Ambulatory Visit (INDEPENDENT_AMBULATORY_CARE_PROVIDER_SITE_OTHER): Payer: Medicare (Managed Care) | Admitting: Podiatry

## 2020-04-25 ENCOUNTER — Other Ambulatory Visit: Payer: Self-pay

## 2020-04-25 DIAGNOSIS — M79676 Pain in unspecified toe(s): Secondary | ICD-10-CM | POA: Diagnosis not present

## 2020-04-25 DIAGNOSIS — B351 Tinea unguium: Secondary | ICD-10-CM | POA: Diagnosis not present

## 2020-04-25 DIAGNOSIS — E0843 Diabetes mellitus due to underlying condition with diabetic autonomic (poly)neuropathy: Secondary | ICD-10-CM

## 2020-04-25 NOTE — Progress Notes (Signed)
   SUBJECTIVE Patient with a history of diabetes mellitus presents to office today complaining of elongated, thickened nails that cause pain while ambulating in shoes.  She is unable to trim her own nails.  Patient states that her last A1c was 9.3 approximately 3 months ago.  She is aware that is high/elevated and she is working with her PCP to get it down.  Patient is here for further evaluation and treatment.   Past Medical History:  Diagnosis Date  . CKD (chronic kidney disease), stage III [facility])    "go to [facility] on [address]" (04/23/2017)  . Hypercholesterolemia   . Hypertension   . TIA (transient ischemic attack) 07/2013   denies residual on 04/23/2017  . Type II diabetes mellitus ([place])     OBJECTIVE General Patient is awake, alert, and oriented x 3 and in no acute distress. Derm Skin is dry and supple bilateral. Negative open lesions or macerations. Remaining integument unremarkable. Nails are tender, long, thickened and dystrophic with subungual debris, consistent with onychomycosis, 1-5 bilateral. No signs of infection noted. Vasc  DP and PT pedal pulses palpable bilaterally. Temperature gradient within normal limits.  Neuro Epicritic and protective threshold sensation diminished bilaterally.  Musculoskeletal Exam No symptomatic pedal deformities noted bilateral. Muscular strength within normal limits.  ASSESSMENT 1. Diabetes Mellitus w/ peripheral neuropathy 2. Onychomycosis of nail due to dermatophyte bilateral 3. Pain in foot bilateral  PLAN OF CARE 1. Patient evaluated today. 2. Instructed to maintain good pedal hygiene and foot care. Stressed importance of controlling blood sugar.  3. Mechanical debridement of nails 1-5 bilaterally performed using a nail nipper. Filed with dremel without incident.  4. Return to clinic in 3 mos.     Edrick Kins, DPM [facility]  Dr. Edrick Kins, [address]                                         [place], [place] [address]                Office [phone]  Fax [phone]

## 2020-04-29 ENCOUNTER — Other Ambulatory Visit: Payer: Self-pay

## 2020-04-29 ENCOUNTER — Encounter (INDEPENDENT_AMBULATORY_CARE_PROVIDER_SITE_OTHER): Payer: Medicare (Managed Care) | Admitting: Ophthalmology

## 2020-04-29 DIAGNOSIS — E113513 Type 2 diabetes mellitus with proliferative diabetic retinopathy with macular edema, bilateral: Secondary | ICD-10-CM | POA: Diagnosis not present

## 2020-04-29 DIAGNOSIS — H35033 Hypertensive retinopathy, bilateral: Secondary | ICD-10-CM

## 2020-04-29 DIAGNOSIS — E11311 Type 2 diabetes mellitus with unspecified diabetic retinopathy with macular edema: Secondary | ICD-10-CM | POA: Diagnosis not present

## 2020-04-29 DIAGNOSIS — I10 Essential (primary) hypertension: Secondary | ICD-10-CM

## 2020-04-29 DIAGNOSIS — H43813 Vitreous degeneration, bilateral: Secondary | ICD-10-CM

## 2020-05-20 ENCOUNTER — Ambulatory Visit (INDEPENDENT_AMBULATORY_CARE_PROVIDER_SITE_OTHER): Payer: Medicare (Managed Care) | Admitting: Endocrinology

## 2020-05-20 ENCOUNTER — Encounter: Payer: Self-pay | Admitting: Endocrinology

## 2020-05-20 ENCOUNTER — Other Ambulatory Visit: Payer: Self-pay

## 2020-05-20 VITALS — BP 130/70 | HR 64 | Ht 64.0 in | Wt 209.0 lb

## 2020-05-20 DIAGNOSIS — N184 Chronic kidney disease, stage 4 (severe): Secondary | ICD-10-CM

## 2020-05-20 DIAGNOSIS — E1122 Type 2 diabetes mellitus with diabetic chronic kidney disease: Secondary | ICD-10-CM | POA: Diagnosis not present

## 2020-05-20 DIAGNOSIS — E083299 Diabetes mellitus due to underlying condition with mild nonproliferative diabetic retinopathy without macular edema, unspecified eye: Secondary | ICD-10-CM

## 2020-05-20 LAB — POCT GLYCOSYLATED HEMOGLOBIN (HGB A1C): Hemoglobin A1C: 9.3 % — AB (ref 4.0–5.6)

## 2020-05-20 MED ORDER — TRULICITY 0.75 MG/0.5ML ~~LOC~~ SOAJ: 0.7500 mg | SUBCUTANEOUS | 3 refills | Status: DC

## 2020-05-20 NOTE — Progress Notes (Signed)
Subjective:    Patient ID: Kelly Blair, female    DOB: June 28, 1951, 69 y.o.   MRN: [id number]  HPI pt is referred by Dr Bradd Burner, for diabetes.  Pt states DM was dx'ed in 2001; he is unaware of any chronic complicated by stage 4 CRI, CVA, and PDR; she has been on insulin since soon after dx; pt says her diet and exercise are poor; she has never had GDM, pancreatitis, pancreatic surgery, severe hypoglycemia or DKA.  Main symptom is weight gain.  She seldom has hypoglycemia, and these episodes are mild.  She says fasting cbg varies from 68-258.  She says Tyler Aas is 44 units qd.    Past Medical History:  Diagnosis Date  . CKD (chronic kidney disease), stage III [facility])    "go to [facility] on [address]" (04/23/2017)  . Hypercholesterolemia   . Hypertension   . TIA (transient ischemic attack) 07/2013   denies residual on 04/23/2017  . Type II diabetes mellitus ([place])     Past Surgical History:  Procedure Laterality Date  . CARPAL TUNNEL WITH CUBITAL TUNNEL Left   . COLONOSCOPY W/ POLYPECTOMY  01/2011   Archie Endo 01/23/2011  . EYE SURGERY    . PARS PLANA VITRECTOMY 27 GAUGE Left 04/23/2017  . PARS PLANA VITRECTOMY 27 GAUGE Left 04/23/2017   Procedure: PARS PLANA VITRECTOMY 27 GAUGE;  Surgeon: Hayden Pedro, MD;  Location: [place];  Service: Ophthalmology;  Laterality: Left;  . PHOTOCOAGULATION WITH LASER Left 04/23/2017   Procedure: PHOTOCOAGULATION WITH LASER;  Surgeon: Hayden Pedro, MD;  Location: [place];  Service: Ophthalmology;  Laterality: Left;  . UTERINE FIBROID SURGERY      Social History   Socioeconomic History  . Marital status: Single    Spouse name: Not on file  . Number of children: Not on file  . Years of education: Not on file  . Highest education level: Not on file  Occupational History  . Occupation: laid-off    Comment: [employer]  Tobacco Use  . Smoking status: Never Smoker  . Smokeless tobacco: Never Used  Vaping Use  . Vaping Use: Never used   Substance and Sexual Activity  . Alcohol use: No  . Drug use: No  . Sexual activity: Never  Other Topics Concern  . Not on file  Social History Narrative  . Not on file   Social Determinants of Health   Financial Resource Strain:   . Difficulty of Paying Living Expenses: Not on file  Food Insecurity:   . Worried About Charity fundraiser in the Last Year: Not on file  . Ran Out of Food in the Last Year: Not on file  Transportation Needs:   . Lack of Transportation (Medical): Not on file  . Lack of Transportation (Non-Medical): Not on file  Physical Activity:   . Days of Exercise per Week: Not on file  . Minutes of Exercise per Session: Not on file  Stress:   . Feeling of Stress : Not on file  Social Connections:   . Frequency of Communication with Friends and Family: Not on file  . Frequency of Social Gatherings with Friends and Family: Not on file  . Attends Religious Services: Not on file  . Active Member of Clubs or Organizations: Not on file  . Attends Archivist Meetings: Not on file  . Marital Status: Not on file  Intimate Partner Violence:   . Fear of Current or Ex-Partner: Not on  file  . Emotionally Abused: Not on file  . Physically Abused: Not on file  . Sexually Abused: Not on file    Current Outpatient Medications on File Prior to Visit  Medication Sig Dispense Refill  . acetaminophen (TYLENOL) 500 MG tablet Take 1,000 mg by mouth every 6 (six) hours as needed (for pain/headaches.).    Marland Kitchen amLODipine (NORVASC) 10 MG tablet Take 1 tablet (10 mg total) by mouth daily. 30 tablet 0  . carvedilol (COREG) 25 MG tablet Take 25 mg by mouth 2 (two) times daily.  1  . cloNIDine (CATAPRES) 0.1 MG tablet Take 0.1 mg by mouth 2 (two) times daily.  2  . indapamide (LOZOL) 2.5 MG tablet Take 2.5 mg by mouth daily with breakfast.  12  . losartan (COZAAR) 25 MG tablet Take 25 mg by mouth at bedtime.  3  . Multiple Vitamin (MULTIVITAMIN WITH MINERALS) TABS tablet Take  1 tablet by mouth once a week.    . prednisoLONE acetate (PRED FORTE) 1 % ophthalmic suspension Place 1 drop into the left eye 4 (four) times daily. 5 mL 0  . rosuvastatin (CRESTOR) 10 MG tablet Take 10 mg by mouth daily.  0  . TRESIBA FLEXTOUCH 200 UNIT/ML SOPN Inject 30 Units into the skin at bedtime.  3  . trimethoprim-polymyxin b (POLYTRIM) ophthalmic solution Place 1 drop into both eyes See admin instructions. Four times daily for 2 days following monthly eye injection.  12  . bacitracin-polymyxin b (POLYSPORIN) ophthalmic ointment Place 1 application into the left eye 3 (three) times daily. apply to eye every 12 hours while awake 3.5 g 0  . diclofenac sodium (VOLTAREN) 1 % GEL Apply 2 g topically 4 (four) times daily as needed. For pain.  6  . doxazosin (CARDURA) 4 MG tablet Take 4 mg by mouth at bedtime.  43  . gatifloxacin (ZYMAXID) 0.5 % SOLN Place 1 drop into the left eye 4 (four) times daily.     No current facility-administered medications on file prior to visit.    No Known Allergies  Family History  Problem Relation Age of Onset  . Uterine cancer Mother        deceased, natural causes, dementia  . Diabetes Mother   . Heart disease Father        deceased secondary to septicemia    BP 130/70   Pulse 64   Ht 5\' 4"  (1.626 m)   Wt 209 lb (94.8 kg)   SpO2 98%   BMI 35.87 kg/m     Review of Systems denies chest pain, sob, n/v, urinary frequency, and depression.  She has memory loss, due to CVA.      Objective:   Physical Exam VITAL SIGNS:  See vs page GENERAL: no distress Pulses: dorsalis pedis intact bilat.   MSK: no deformity of the feet CV: 2+ bilat leg edema Skin:  no ulcer on the feet.  normal color and temp on the feet. Neuro: sensation is intact to touch on the feet Ext: there is bilateral onychomycosis of the toenails.    Lab Results  Component Value Date   CREATININE 1.98 (H) 04/23/2017   BUN 30 (H) 04/23/2017   NA 139 04/23/2017   K 4.1  04/23/2017   CL 108 04/23/2017   CO2 21 (L) 04/23/2017   Lab Results  Component Value Date   HGBA1C 9.3 (A) 05/20/2020   I have reviewed outside records, and summarized: Pt was noted to have elevated A1c,  and referred here.  Wellness, CRI, depression, and GERD were also addressed.    Assessment & Plan:  Insulin-requiring type 2 DM, with stage 4 CRI Hypoglycemia, due to insulin: we'll favor GLP rx over insulin.    Patient Instructions  good diet and exercise significantly improve the control of your diabetes.  please let me know if you wish to be referred to a dietician.  high blood sugar is very risky to your health.  you should see an eye doctor and dentist every year.  It is very important to get all recommended vaccinations.  Controlling your blood pressure and cholesterol drastically reduces the damage diabetes does to your body.  Those who smoke should quit.  Please discuss these with your doctor.  check your blood sugar twice a day.  vary the time of day when you check, between before the 3 meals, and at bedtime.  also check if you have symptoms of your blood sugar being too high or too low.  please keep a record of the readings and bring it to your next appointment here (or you can bring the meter itself).  You can write it on any piece of paper.  please call us sooner if your blood sugar goes below 70, or if you have a lot of readings over 200.   I have sent a prescription to your pharmacy, to add "Trulicity," and:  Reduce the Tresiba to 30 units per day.   Please come back for a follow-up appointment in 1 month.

## 2020-05-20 NOTE — Patient Instructions (Addendum)
good diet and exercise significantly improve the control of your diabetes.  please let me know if you wish to be referred to a dietician.  high blood sugar is very risky to your health.  you should see an eye doctor and dentist every year.  It is very important to get all recommended vaccinations.  Controlling your blood pressure and cholesterol drastically reduces the damage diabetes does to your body.  Those who smoke should quit.  Please discuss these with your doctor.  check your blood sugar twice a day.  vary the time of day when you check, between before the 3 meals, and at bedtime.  also check if you have symptoms of your blood sugar being too high or too low.  please keep a record of the readings and bring it to your next appointment here (or you can bring the meter itself).  You can write it on any piece of paper.  please call us sooner if your blood sugar goes below 70, or if you have a lot of readings over 200.   I have sent a prescription to your pharmacy, to add "Trulicity," and:  Reduce the Tresiba to 30 units per day.   Please come back for a follow-up appointment in 1 month.

## 2020-05-25 ENCOUNTER — Other Ambulatory Visit: Payer: Self-pay

## 2020-05-25 ENCOUNTER — Encounter (INDEPENDENT_AMBULATORY_CARE_PROVIDER_SITE_OTHER): Payer: Medicare (Managed Care) | Admitting: Ophthalmology

## 2020-05-25 DIAGNOSIS — E11311 Type 2 diabetes mellitus with unspecified diabetic retinopathy with macular edema: Secondary | ICD-10-CM

## 2020-05-25 DIAGNOSIS — E113513 Type 2 diabetes mellitus with proliferative diabetic retinopathy with macular edema, bilateral: Secondary | ICD-10-CM | POA: Diagnosis not present

## 2020-05-25 DIAGNOSIS — I10 Essential (primary) hypertension: Secondary | ICD-10-CM | POA: Diagnosis not present

## 2020-05-25 DIAGNOSIS — H35033 Hypertensive retinopathy, bilateral: Secondary | ICD-10-CM | POA: Diagnosis not present

## 2020-05-25 DIAGNOSIS — H43813 Vitreous degeneration, bilateral: Secondary | ICD-10-CM

## 2020-06-09 ENCOUNTER — Ambulatory Visit: Payer: Medicare (Managed Care) | Attending: Internal Medicine

## 2020-06-09 DIAGNOSIS — Z23 Encounter for immunization: Secondary | ICD-10-CM

## 2020-06-09 NOTE — Progress Notes (Signed)
   Covid-19 Vaccination Clinic  Name:  VIOLETTE MORNEAULT    MRN: [id number] DOB: 1951-07-11  06/09/2020  Ms. Schiff was observed post Covid-19 immunization for 15 minutes without incident. She was provided with Vaccine Information Sheet and instruction to access the V-Safe system.   Ms. Paulick was instructed to call 911 with any severe reactions post vaccine: Marland Kitchen Difficulty breathing  . Swelling of face and throat  . A fast heartbeat  . A bad rash all over body  . Dizziness and weakness

## 2020-06-22 ENCOUNTER — Other Ambulatory Visit: Payer: Self-pay

## 2020-06-22 ENCOUNTER — Encounter (INDEPENDENT_AMBULATORY_CARE_PROVIDER_SITE_OTHER): Payer: Medicare (Managed Care) | Admitting: Ophthalmology

## 2020-06-22 DIAGNOSIS — H35033 Hypertensive retinopathy, bilateral: Secondary | ICD-10-CM

## 2020-06-22 DIAGNOSIS — H35372 Puckering of macula, left eye: Secondary | ICD-10-CM

## 2020-06-22 DIAGNOSIS — H43813 Vitreous degeneration, bilateral: Secondary | ICD-10-CM

## 2020-06-22 DIAGNOSIS — E11311 Type 2 diabetes mellitus with unspecified diabetic retinopathy with macular edema: Secondary | ICD-10-CM | POA: Diagnosis not present

## 2020-06-22 DIAGNOSIS — I10 Essential (primary) hypertension: Secondary | ICD-10-CM

## 2020-06-22 DIAGNOSIS — E113513 Type 2 diabetes mellitus with proliferative diabetic retinopathy with macular edema, bilateral: Secondary | ICD-10-CM | POA: Diagnosis not present

## 2020-06-24 ENCOUNTER — Ambulatory Visit (INDEPENDENT_AMBULATORY_CARE_PROVIDER_SITE_OTHER): Payer: Medicare (Managed Care) | Admitting: Endocrinology

## 2020-06-24 ENCOUNTER — Other Ambulatory Visit: Payer: Self-pay

## 2020-06-24 DIAGNOSIS — E1122 Type 2 diabetes mellitus with diabetic chronic kidney disease: Secondary | ICD-10-CM

## 2020-06-24 DIAGNOSIS — Z794 Long term (current) use of insulin: Secondary | ICD-10-CM

## 2020-06-24 DIAGNOSIS — N184 Chronic kidney disease, stage 4 (severe): Secondary | ICD-10-CM | POA: Diagnosis not present

## 2020-06-24 MED ORDER — TRULICITY 1.5 MG/0.5ML ~~LOC~~ SOAJ: 1.5000 mg | SUBCUTANEOUS | 3 refills | Status: AC

## 2020-06-24 NOTE — Patient Instructions (Addendum)
Your blood pressure is high today.  Please see your primary care provider soon, to have it rechecked.   check your blood sugar twice a day.  vary the time of day when you check, between before the 3 meals, and at bedtime.  also check if you have symptoms of your blood sugar being too high or too low.  please keep a record of the readings and bring it to your next appointment here (or you can bring the meter itself).  You can write it on any piece of paper.  please call us sooner if your blood sugar goes below 70, or if you have a lot of readings over 200.   I have sent a prescription to your pharmacy, to increase the Trulicity, and:  Please continue the same [place].  Please come back for a follow-up appointment in 2 months.

## 2020-06-24 NOTE — Progress Notes (Signed)
Subjective:    Patient ID: Kelly Blair, female    DOB: [birth year], 69 y.o.   MRN: [id number]  HPI  Pt returns for f/u of diabetes mellitus: DM type: Insulin-requiring type 2 Dx'ed: 6962 Complications: stage 4 CRI, CVA, and PDR Therapy: insulin since soon after dx GDM: never DKA: never Severe hypoglycemia: never Pancreatitis: never Pancreatic imaging: never SDOH: none Other: goal is to d/c insulin Interval history: no cbg record, but states cbg's vary from 124-200.  There is no trend throughout the day. pt states she feels well in general.   Past Medical History:  Diagnosis Date  . CKD (chronic kidney disease), stage III [facility])    "go to [facility] on [address]" (04/23/2017)  . Hypercholesterolemia   . Hypertension   . TIA (transient ischemic attack) 07/2013   denies residual on 04/23/2017  . Type II diabetes mellitus ([place])     Past Surgical History:  Procedure Laterality Date  . CARPAL TUNNEL WITH CUBITAL TUNNEL Left   . COLONOSCOPY W/ POLYPECTOMY  01/2011   Archie Endo 01/23/2011  . EYE SURGERY    . PARS PLANA VITRECTOMY 27 GAUGE Left 04/23/2017  . PARS PLANA VITRECTOMY 27 GAUGE Left 04/23/2017   Procedure: PARS PLANA VITRECTOMY 27 GAUGE;  Surgeon: Hayden Pedro, MD;  Location: [place];  Service: Ophthalmology;  Laterality: Left;  . PHOTOCOAGULATION WITH LASER Left 04/23/2017   Procedure: PHOTOCOAGULATION WITH LASER;  Surgeon: Hayden Pedro, MD;  Location: [place];  Service: Ophthalmology;  Laterality: Left;  . UTERINE FIBROID SURGERY      Social History   Socioeconomic History  . Marital status: Single    Spouse name: Not on file  . Number of children: Not on file  . Years of education: Not on file  . Highest education level: Not on file  Occupational History  . Occupation: laid-off    Comment: [employer]  Tobacco Use  . Smoking status: Never Smoker  . Smokeless tobacco: Never Used  Vaping Use  . Vaping Use: Never used  Substance and Sexual  Activity  . Alcohol use: No  . Drug use: No  . Sexual activity: Never  Other Topics Concern  . Not on file  Social History Narrative  . Not on file   Social Determinants of Health   Financial Resource Strain:   . Difficulty of Paying Living Expenses: Not on file  Food Insecurity:   . Worried About Charity fundraiser in the Last Year: Not on file  . Ran Out of Food in the Last Year: Not on file  Transportation Needs:   . Lack of Transportation (Medical): Not on file  . Lack of Transportation (Non-Medical): Not on file  Physical Activity:   . Days of Exercise per Week: Not on file  . Minutes of Exercise per Session: Not on file  Stress:   . Feeling of Stress : Not on file  Social Connections:   . Frequency of Communication with Friends and Family: Not on file  . Frequency of Social Gatherings with Friends and Family: Not on file  . Attends Religious Services: Not on file  . Active Member of Clubs or Organizations: Not on file  . Attends Archivist Meetings: Not on file  . Marital Status: Not on file  Intimate Partner Violence:   . Fear of Current or Ex-Partner: Not on file  . Emotionally Abused: Not on file  . Physically Abused: Not on file  . Sexually  Abused: Not on file    Current Outpatient Medications on File Prior to Visit  Medication Sig Dispense Refill  . acetaminophen (TYLENOL) 500 MG tablet Take 1,000 mg by mouth every 6 (six) hours as needed (for pain/headaches.).    Marland Kitchen amLODipine (NORVASC) 10 MG tablet Take 1 tablet (10 mg total) by mouth daily. 30 tablet 0  . carvedilol (COREG) 25 MG tablet Take 25 mg by mouth 2 (two) times daily.  1  . cloNIDine (CATAPRES) 0.1 MG tablet Take 0.1 mg by mouth 2 (two) times daily.  2  . indapamide (LOZOL) 2.5 MG tablet Take 2.5 mg by mouth daily with breakfast.  12  . losartan (COZAAR) 25 MG tablet Take 25 mg by mouth at bedtime.  3  . Multiple Vitamin (MULTIVITAMIN WITH MINERALS) TABS tablet Take 1 tablet by mouth once  a week.    . prednisoLONE acetate (PRED FORTE) 1 % ophthalmic suspension Place 1 drop into the left eye 4 (four) times daily. 5 mL 0  . rosuvastatin (CRESTOR) 10 MG tablet Take 10 mg by mouth daily.  0  . TRESIBA FLEXTOUCH 200 UNIT/ML SOPN Inject 30 Units into the skin at bedtime.  3  . trimethoprim-polymyxin b (POLYTRIM) ophthalmic solution Place 1 drop into both eyes See admin instructions. Four times daily for 2 days following monthly eye injection.  12   No current facility-administered medications on file prior to visit.    No Known Allergies  Family History  Problem Relation Age of Onset  . Uterine cancer Mother        deceased, natural causes, dementia  . Diabetes Mother   . Heart disease Father        deceased secondary to septicemia    BP (!) 160/86   Pulse 64   Ht 5\' 4"  (1.626 m)   Wt 203 lb (92.1 kg)   SpO2 98%   BMI 34.84 kg/m    Review of Systems Denies n/v.  She denies hypoglycemia.    Objective:   Physical Exam VITAL SIGNS:  See vs page GENERAL: no distress Pulses: dorsalis pedis intact bilat.   MSK: no deformity of the feet CV: trace bilat leg edema Skin:  no ulcer on the feet.  normal color and temp on the feet. Neuro: sensation is intact to touch on the feet Ext: there is bilateral onychomycosis of the toenails  Lab Results  Component Value Date   HGBA1C 9.3 (A) 05/20/2020       Assessment & Plan:  Insulin-requiring type 2 DM, with stage 4 CRI: uncontrolled.  HTN: is noted today  Patient Instructions  Your blood pressure is high today.  Please see your primary care provider soon, to have it rechecked.   check your blood sugar twice a day.  vary the time of day when you check, between before the 3 meals, and at bedtime.  also check if you have symptoms of your blood sugar being too high or too low.  please keep a record of the readings and bring it to your next appointment here (or you can bring the meter itself).  You can write it on any piece  of paper.  please call us sooner if your blood sugar goes below 70, or if you have a lot of readings over 200.   I have sent a prescription to your pharmacy, to increase the Trulicity, and:  Please continue the same [place].  Please come back for a follow-up appointment in 2 months.

## 2020-06-25 DIAGNOSIS — E119 Type 2 diabetes mellitus without complications: Secondary | ICD-10-CM | POA: Insufficient documentation

## 2020-07-20 ENCOUNTER — Encounter (INDEPENDENT_AMBULATORY_CARE_PROVIDER_SITE_OTHER): Payer: Medicare (Managed Care) | Admitting: Ophthalmology

## 2020-07-22 ENCOUNTER — Other Ambulatory Visit: Payer: Self-pay

## 2020-07-22 ENCOUNTER — Encounter (INDEPENDENT_AMBULATORY_CARE_PROVIDER_SITE_OTHER): Payer: Medicare (Managed Care) | Admitting: Ophthalmology

## 2020-07-22 DIAGNOSIS — H43813 Vitreous degeneration, bilateral: Secondary | ICD-10-CM

## 2020-07-22 DIAGNOSIS — I10 Essential (primary) hypertension: Secondary | ICD-10-CM | POA: Diagnosis not present

## 2020-07-22 DIAGNOSIS — H35033 Hypertensive retinopathy, bilateral: Secondary | ICD-10-CM

## 2020-07-22 DIAGNOSIS — E113513 Type 2 diabetes mellitus with proliferative diabetic retinopathy with macular edema, bilateral: Secondary | ICD-10-CM

## 2020-07-22 DIAGNOSIS — H35372 Puckering of macula, left eye: Secondary | ICD-10-CM | POA: Diagnosis not present

## 2020-07-25 ENCOUNTER — Ambulatory Visit: Payer: Medicare (Managed Care) | Admitting: Podiatry

## 2020-07-28 ENCOUNTER — Other Ambulatory Visit: Payer: Medicare (Managed Care)

## 2020-08-01 ENCOUNTER — Ambulatory Visit: Payer: Medicare (Managed Care) | Admitting: Podiatry

## 2020-08-02 ENCOUNTER — Other Ambulatory Visit (HOSPITAL_COMMUNITY): Payer: Self-pay | Admitting: Family Medicine

## 2020-08-02 DIAGNOSIS — Z1231 Encounter for screening mammogram for malignant neoplasm of breast: Secondary | ICD-10-CM

## 2020-08-04 ENCOUNTER — Other Ambulatory Visit: Payer: Medicare (Managed Care)

## 2020-08-04 DIAGNOSIS — Z20822 Contact with and (suspected) exposure to covid-19: Secondary | ICD-10-CM

## 2020-08-06 LAB — NOVEL CORONAVIRUS, NAA: SARS-CoV-2, NAA: NOT DETECTED

## 2020-08-06 LAB — SARS-COV-2, NAA 2 DAY TAT

## 2020-08-17 ENCOUNTER — Ambulatory Visit (INDEPENDENT_AMBULATORY_CARE_PROVIDER_SITE_OTHER): Payer: Medicare (Managed Care) | Admitting: Podiatry

## 2020-08-17 ENCOUNTER — Encounter: Payer: Self-pay | Admitting: Podiatry

## 2020-08-17 ENCOUNTER — Other Ambulatory Visit: Payer: Self-pay

## 2020-08-17 DIAGNOSIS — E0843 Diabetes mellitus due to underlying condition with diabetic autonomic (poly)neuropathy: Secondary | ICD-10-CM | POA: Diagnosis not present

## 2020-08-17 DIAGNOSIS — M79676 Pain in unspecified toe(s): Secondary | ICD-10-CM

## 2020-08-17 DIAGNOSIS — R601 Generalized edema: Secondary | ICD-10-CM | POA: Diagnosis not present

## 2020-08-17 DIAGNOSIS — B351 Tinea unguium: Secondary | ICD-10-CM

## 2020-08-17 NOTE — Addendum Note (Signed)
Addended by: Boneta Lucks on: 08/17/2020 02:20 PM   Modules accepted: Level of Service

## 2020-08-17 NOTE — Progress Notes (Signed)
  Subjective:  Patient ID: Kelly Blair, female    DOB: [birth year],  MRN: [id number]  Chief Complaint  Patient presents with  . routine foot care    Nail trim   70 y.o. female returns for the above complaint.  Patient presents with thickened elongated dystrophic toenails x10.  They are painful to touch.  She would like for me to debride them down.She is  Is a diabetic.  Her last A1c is 9.3.  She also states that she has ankle swelling that comes out of nowhere more especially when she is in a dependent position.  She would like to know if there is anything that could done for her.  She does not wear compression socks or does elevation.  Objective:  There were no vitals filed for this visit. Podiatric Exam: Vascular: dorsalis pedis and posterior tibial pulses are palpable bilateral. Capillary return is immediate. Temperature gradient is WNL. Skin turgor WNL  Sensorium: Normal Semmes Weinstein monofilament test. Normal tactile sensation bilaterally. Nail Exam: Pt has thick disfigured discolored nails with subungual debris noted bilateral entire nail hallux through fifth toenails.  Pain on palpation to the nails. Ulcer Exam: There is no evidence of ulcer or pre-ulcerative changes or infection. Orthopedic Exam: Muscle tone and strength are WNL. No limitations in general ROM. No crepitus or effusions noted. HAV  B/L.  Hammer toes 2-5  B/L. Skin: No Porokeratosis. No infection or ulcers bilateral lower extremity generalized edema noted 1+ pitting.  No ulceration noted.    Assessment & Plan:   1. Generalized edema   2. Diabetes mellitus due to underlying condition with diabetic autonomic neuropathy, unspecified whether long term insulin use (HCC)   3. Pain due to onychomycosis of toenail     Patient was evaluated and treated and all questions answered.  Bilateral generalized edema -I explained to the patient the etiology of edema and various treatment options were discussed.  Given that  this is very mild in nature especially while being in a dependent position I discussed with the patient she will benefit from compression socks as well as elevation to help with it especially when being on the foot.  She states understanding will obtain compression socks right away.  Onychomycosis with pain  -Nails palliatively debrided as below. -Educated on self-care  Procedure: Nail Debridement Rationale: pain  Type of Debridement: manual, sharp debridement. Instrumentation: Nail nipper, rotary burr. Number of Nails: 10  Procedures and Treatment: Consent by patient was obtained for treatment procedures. The patient understood the discussion of treatment and procedures well. All questions were answered thoroughly reviewed. Debridement of mycotic and hypertrophic toenails, 1 through 5 bilateral and clearing of subungual debris. No ulceration, no infection noted.  Return Visit-Office Procedure: Patient instructed to return to the office for a follow up visit 3 months for continued evaluation and treatment.  Boneta Lucks, DPM    No follow-ups on file.

## 2020-08-22 ENCOUNTER — Encounter (INDEPENDENT_AMBULATORY_CARE_PROVIDER_SITE_OTHER): Payer: Medicare (Managed Care) | Admitting: Ophthalmology

## 2020-08-22 ENCOUNTER — Ambulatory Visit (HOSPITAL_COMMUNITY): Payer: Medicare (Managed Care)

## 2020-08-26 ENCOUNTER — Ambulatory Visit: Payer: Medicare (Managed Care) | Admitting: Endocrinology

## 2020-08-26 ENCOUNTER — Telehealth: Payer: Self-pay | Admitting: Endocrinology

## 2020-08-26 NOTE — Telephone Encounter (Signed)
Received a call from pt's primary care office. They were trying to cancel her appt for 10/03/20 stating she already sees them for her diabetes and does not need to see 2 Drs. I called the pt and she had no idea about this and is going to be following up with her primary care dr and give [place] a call back with regards to what she wants to do.

## 2020-08-29 ENCOUNTER — Other Ambulatory Visit: Payer: Self-pay

## 2020-08-29 ENCOUNTER — Encounter (INDEPENDENT_AMBULATORY_CARE_PROVIDER_SITE_OTHER): Payer: Medicare (Managed Care) | Admitting: Ophthalmology

## 2020-08-29 DIAGNOSIS — H43813 Vitreous degeneration, bilateral: Secondary | ICD-10-CM | POA: Diagnosis not present

## 2020-08-29 DIAGNOSIS — H35033 Hypertensive retinopathy, bilateral: Secondary | ICD-10-CM | POA: Diagnosis not present

## 2020-08-29 DIAGNOSIS — E113513 Type 2 diabetes mellitus with proliferative diabetic retinopathy with macular edema, bilateral: Secondary | ICD-10-CM | POA: Diagnosis not present

## 2020-08-29 DIAGNOSIS — I10 Essential (primary) hypertension: Secondary | ICD-10-CM

## 2020-09-05 ENCOUNTER — Other Ambulatory Visit: Payer: Self-pay

## 2020-09-05 ENCOUNTER — Ambulatory Visit (HOSPITAL_COMMUNITY)
Admission: RE | Admit: 2020-09-05 | Discharge: 2020-09-05 | Disposition: A | Discharge status: Home / Self Care | Payer: Medicare (Managed Care) | Source: Ambulatory Visit | Attending: Family Medicine | Admitting: Family Medicine

## 2020-09-05 DIAGNOSIS — Z1231 Encounter for screening mammogram for malignant neoplasm of breast: Secondary | ICD-10-CM | POA: Insufficient documentation

## 2020-09-26 ENCOUNTER — Encounter (INDEPENDENT_AMBULATORY_CARE_PROVIDER_SITE_OTHER): Payer: Medicare (Managed Care) | Admitting: Ophthalmology

## 2020-09-26 ENCOUNTER — Other Ambulatory Visit: Payer: Self-pay

## 2020-09-26 DIAGNOSIS — I10 Essential (primary) hypertension: Secondary | ICD-10-CM

## 2020-09-26 DIAGNOSIS — E113513 Type 2 diabetes mellitus with proliferative diabetic retinopathy with macular edema, bilateral: Secondary | ICD-10-CM

## 2020-09-26 DIAGNOSIS — H35033 Hypertensive retinopathy, bilateral: Secondary | ICD-10-CM

## 2020-09-26 DIAGNOSIS — H43813 Vitreous degeneration, bilateral: Secondary | ICD-10-CM

## 2020-10-03 ENCOUNTER — Ambulatory Visit: Payer: Medicare (Managed Care) | Admitting: Endocrinology

## 2020-10-24 ENCOUNTER — Other Ambulatory Visit: Payer: Self-pay

## 2020-10-24 ENCOUNTER — Encounter (INDEPENDENT_AMBULATORY_CARE_PROVIDER_SITE_OTHER): Payer: Medicare (Managed Care) | Admitting: Ophthalmology

## 2020-10-24 DIAGNOSIS — H35033 Hypertensive retinopathy, bilateral: Secondary | ICD-10-CM

## 2020-10-24 DIAGNOSIS — I10 Essential (primary) hypertension: Secondary | ICD-10-CM | POA: Diagnosis not present

## 2020-10-24 DIAGNOSIS — E113513 Type 2 diabetes mellitus with proliferative diabetic retinopathy with macular edema, bilateral: Secondary | ICD-10-CM

## 2020-10-24 DIAGNOSIS — H43811 Vitreous degeneration, right eye: Secondary | ICD-10-CM

## 2020-11-25 ENCOUNTER — Other Ambulatory Visit: Payer: Self-pay

## 2020-11-25 ENCOUNTER — Ambulatory Visit: Payer: Medicare (Managed Care) | Admitting: Podiatry

## 2020-11-25 ENCOUNTER — Encounter: Payer: Self-pay | Admitting: Podiatry

## 2020-11-25 ENCOUNTER — Ambulatory Visit (INDEPENDENT_AMBULATORY_CARE_PROVIDER_SITE_OTHER): Payer: Medicare (Managed Care) | Admitting: Podiatry

## 2020-11-25 DIAGNOSIS — M79676 Pain in unspecified toe(s): Secondary | ICD-10-CM

## 2020-11-25 DIAGNOSIS — E0843 Diabetes mellitus due to underlying condition with diabetic autonomic (poly)neuropathy: Secondary | ICD-10-CM

## 2020-11-25 DIAGNOSIS — B351 Tinea unguium: Secondary | ICD-10-CM

## 2020-11-28 ENCOUNTER — Encounter (INDEPENDENT_AMBULATORY_CARE_PROVIDER_SITE_OTHER): Payer: Medicare (Managed Care) | Admitting: Ophthalmology

## 2020-11-30 ENCOUNTER — Encounter: Payer: Self-pay | Admitting: Podiatry

## 2020-11-30 NOTE — Progress Notes (Signed)
  Subjective:  Patient ID: Kelly Blair, female    DOB: Jan 28, 1951,  MRN: [id number]  Chief Complaint  Patient presents with  . Nail Problem    Nail trim    70 y.o. female returns for the above complaint.  Patient presents with thickened elongated dystrophic toenails x10.  They are painful to touch.  She would like for me to debride them down.She is  Is a diabetic.  Her last A1c is 9.3.  She denies any secondary complaints.  Objective:  There were no vitals filed for this visit. Podiatric Exam: Vascular: dorsalis pedis and posterior tibial pulses are palpable bilateral. Capillary return is immediate. Temperature gradient is WNL. Skin turgor WNL  Sensorium: Normal Semmes Weinstein monofilament test. Normal tactile sensation bilaterally. Nail Exam: Pt has thick disfigured discolored nails with subungual debris noted bilateral entire nail hallux through fifth toenails.  Pain on palpation to the nails. Ulcer Exam: There is no evidence of ulcer or pre-ulcerative changes or infection. Orthopedic Exam: Muscle tone and strength are WNL. No limitations in general ROM. No crepitus or effusions noted. HAV  B/L.  Hammer toes 2-5  B/L. Skin: No Porokeratosis. No infection or ulcers bilateral lower extremity generalized edema noted 1+ pitting.  No ulceration noted.    Assessment & Plan:   No diagnosis found.  Patient was evaluated and treated and all questions answered.  Bilateral generalized edema -I explained to the patient the etiology of edema and various treatment options were discussed.  Given that this is very mild in nature especially while being in a dependent position I discussed with the patient she will benefit from compression socks as well as elevation to help with it especially when being on the foot.  She states understanding will obtain compression socks right away.  Onychomycosis with pain  -Nails palliatively debrided as below. -Educated on self-care  Procedure: Nail  Debridement Rationale: pain  Type of Debridement: manual, sharp debridement. Instrumentation: Nail nipper, rotary burr. Number of Nails: 10  Procedures and Treatment: Consent by patient was obtained for treatment procedures. The patient understood the discussion of treatment and procedures well. All questions were answered thoroughly reviewed. Debridement of mycotic and hypertrophic toenails, 1 through 5 bilateral and clearing of subungual debris. No ulceration, no infection noted.  Return Visit-Office Procedure: Patient instructed to return to the office for a follow up visit 3 months for continued evaluation and treatment.  Boneta Lucks, DPM    Return in about 3 months (around 02/24/2021).

## 2020-12-01 ENCOUNTER — Other Ambulatory Visit: Payer: Self-pay

## 2020-12-01 ENCOUNTER — Encounter (INDEPENDENT_AMBULATORY_CARE_PROVIDER_SITE_OTHER): Payer: Medicare (Managed Care) | Admitting: Ophthalmology

## 2020-12-01 DIAGNOSIS — I10 Essential (primary) hypertension: Secondary | ICD-10-CM | POA: Diagnosis not present

## 2020-12-01 DIAGNOSIS — H43813 Vitreous degeneration, bilateral: Secondary | ICD-10-CM

## 2020-12-01 DIAGNOSIS — E113513 Type 2 diabetes mellitus with proliferative diabetic retinopathy with macular edema, bilateral: Secondary | ICD-10-CM | POA: Diagnosis not present

## 2020-12-01 DIAGNOSIS — H35033 Hypertensive retinopathy, bilateral: Secondary | ICD-10-CM | POA: Diagnosis not present

## 2020-12-28 ENCOUNTER — Encounter (INDEPENDENT_AMBULATORY_CARE_PROVIDER_SITE_OTHER): Payer: Medicare (Managed Care) | Admitting: Ophthalmology

## 2020-12-28 ENCOUNTER — Other Ambulatory Visit: Payer: Self-pay

## 2020-12-28 DIAGNOSIS — H43811 Vitreous degeneration, right eye: Secondary | ICD-10-CM

## 2020-12-28 DIAGNOSIS — E113513 Type 2 diabetes mellitus with proliferative diabetic retinopathy with macular edema, bilateral: Secondary | ICD-10-CM

## 2020-12-28 DIAGNOSIS — H35033 Hypertensive retinopathy, bilateral: Secondary | ICD-10-CM

## 2020-12-28 DIAGNOSIS — I10 Essential (primary) hypertension: Secondary | ICD-10-CM | POA: Diagnosis not present

## 2021-01-19 ENCOUNTER — Encounter: Payer: Self-pay | Admitting: Internal Medicine

## 2021-01-25 ENCOUNTER — Encounter (INDEPENDENT_AMBULATORY_CARE_PROVIDER_SITE_OTHER): Payer: Medicare (Managed Care) | Admitting: Ophthalmology

## 2021-01-25 ENCOUNTER — Other Ambulatory Visit: Payer: Self-pay

## 2021-01-25 DIAGNOSIS — H43811 Vitreous degeneration, right eye: Secondary | ICD-10-CM | POA: Diagnosis not present

## 2021-01-25 DIAGNOSIS — E113513 Type 2 diabetes mellitus with proliferative diabetic retinopathy with macular edema, bilateral: Secondary | ICD-10-CM | POA: Diagnosis not present

## 2021-01-25 DIAGNOSIS — H35033 Hypertensive retinopathy, bilateral: Secondary | ICD-10-CM

## 2021-01-25 DIAGNOSIS — I10 Essential (primary) hypertension: Secondary | ICD-10-CM | POA: Diagnosis not present

## 2021-02-24 ENCOUNTER — Other Ambulatory Visit: Payer: Self-pay

## 2021-02-24 ENCOUNTER — Encounter (INDEPENDENT_AMBULATORY_CARE_PROVIDER_SITE_OTHER): Payer: Medicare (Managed Care) | Admitting: Ophthalmology

## 2021-02-24 DIAGNOSIS — E113513 Type 2 diabetes mellitus with proliferative diabetic retinopathy with macular edema, bilateral: Secondary | ICD-10-CM | POA: Diagnosis not present

## 2021-02-24 DIAGNOSIS — I10 Essential (primary) hypertension: Secondary | ICD-10-CM

## 2021-02-24 DIAGNOSIS — H35033 Hypertensive retinopathy, bilateral: Secondary | ICD-10-CM

## 2021-02-24 DIAGNOSIS — H43811 Vitreous degeneration, right eye: Secondary | ICD-10-CM | POA: Diagnosis not present

## 2021-03-03 ENCOUNTER — Ambulatory Visit (INDEPENDENT_AMBULATORY_CARE_PROVIDER_SITE_OTHER): Payer: Medicare (Managed Care) | Admitting: Podiatry

## 2021-03-03 DIAGNOSIS — B351 Tinea unguium: Secondary | ICD-10-CM

## 2021-03-03 DIAGNOSIS — M79676 Pain in unspecified toe(s): Secondary | ICD-10-CM

## 2021-03-24 ENCOUNTER — Encounter (INDEPENDENT_AMBULATORY_CARE_PROVIDER_SITE_OTHER): Payer: Medicare (Managed Care) | Admitting: Ophthalmology

## 2021-03-24 ENCOUNTER — Other Ambulatory Visit: Payer: Self-pay

## 2021-03-24 DIAGNOSIS — H35033 Hypertensive retinopathy, bilateral: Secondary | ICD-10-CM

## 2021-03-24 DIAGNOSIS — I10 Essential (primary) hypertension: Secondary | ICD-10-CM

## 2021-03-24 DIAGNOSIS — E113513 Type 2 diabetes mellitus with proliferative diabetic retinopathy with macular edema, bilateral: Secondary | ICD-10-CM | POA: Diagnosis not present

## 2021-03-24 DIAGNOSIS — H43811 Vitreous degeneration, right eye: Secondary | ICD-10-CM

## 2021-04-21 ENCOUNTER — Encounter (INDEPENDENT_AMBULATORY_CARE_PROVIDER_SITE_OTHER): Payer: Medicare (Managed Care) | Admitting: Ophthalmology

## 2021-04-21 ENCOUNTER — Other Ambulatory Visit: Payer: Self-pay

## 2021-04-21 DIAGNOSIS — H43811 Vitreous degeneration, right eye: Secondary | ICD-10-CM | POA: Diagnosis not present

## 2021-04-21 DIAGNOSIS — I10 Essential (primary) hypertension: Secondary | ICD-10-CM

## 2021-04-21 DIAGNOSIS — H35033 Hypertensive retinopathy, bilateral: Secondary | ICD-10-CM | POA: Diagnosis not present

## 2021-04-21 DIAGNOSIS — E113513 Type 2 diabetes mellitus with proliferative diabetic retinopathy with macular edema, bilateral: Secondary | ICD-10-CM | POA: Diagnosis not present

## 2021-05-19 ENCOUNTER — Encounter (INDEPENDENT_AMBULATORY_CARE_PROVIDER_SITE_OTHER): Payer: Medicare (Managed Care) | Admitting: Ophthalmology

## 2021-05-23 ENCOUNTER — Encounter (INDEPENDENT_AMBULATORY_CARE_PROVIDER_SITE_OTHER): Payer: Medicare (Managed Care) | Admitting: Ophthalmology

## 2021-05-23 ENCOUNTER — Encounter (INDEPENDENT_AMBULATORY_CARE_PROVIDER_SITE_OTHER): Payer: Self-pay

## 2021-05-24 ENCOUNTER — Other Ambulatory Visit: Payer: Self-pay

## 2021-05-24 ENCOUNTER — Encounter (INDEPENDENT_AMBULATORY_CARE_PROVIDER_SITE_OTHER): Payer: Medicare (Managed Care) | Admitting: Ophthalmology

## 2021-05-24 DIAGNOSIS — I10 Essential (primary) hypertension: Secondary | ICD-10-CM

## 2021-05-24 DIAGNOSIS — H43811 Vitreous degeneration, right eye: Secondary | ICD-10-CM

## 2021-05-24 DIAGNOSIS — H35033 Hypertensive retinopathy, bilateral: Secondary | ICD-10-CM

## 2021-05-24 DIAGNOSIS — E113513 Type 2 diabetes mellitus with proliferative diabetic retinopathy with macular edema, bilateral: Secondary | ICD-10-CM

## 2021-06-21 ENCOUNTER — Other Ambulatory Visit: Payer: Self-pay

## 2021-06-21 ENCOUNTER — Encounter (INDEPENDENT_AMBULATORY_CARE_PROVIDER_SITE_OTHER): Payer: Medicare (Managed Care) | Admitting: Ophthalmology

## 2021-06-21 DIAGNOSIS — H43811 Vitreous degeneration, right eye: Secondary | ICD-10-CM | POA: Diagnosis not present

## 2021-06-21 DIAGNOSIS — H35033 Hypertensive retinopathy, bilateral: Secondary | ICD-10-CM

## 2021-06-21 DIAGNOSIS — E113513 Type 2 diabetes mellitus with proliferative diabetic retinopathy with macular edema, bilateral: Secondary | ICD-10-CM

## 2021-06-21 DIAGNOSIS — I10 Essential (primary) hypertension: Secondary | ICD-10-CM

## 2021-07-19 ENCOUNTER — Other Ambulatory Visit: Payer: Self-pay

## 2021-07-19 ENCOUNTER — Encounter (INDEPENDENT_AMBULATORY_CARE_PROVIDER_SITE_OTHER): Payer: Medicare (Managed Care) | Admitting: Ophthalmology

## 2021-07-19 DIAGNOSIS — H35033 Hypertensive retinopathy, bilateral: Secondary | ICD-10-CM | POA: Diagnosis not present

## 2021-07-19 DIAGNOSIS — E113513 Type 2 diabetes mellitus with proliferative diabetic retinopathy with macular edema, bilateral: Secondary | ICD-10-CM

## 2021-07-19 DIAGNOSIS — H43811 Vitreous degeneration, right eye: Secondary | ICD-10-CM | POA: Diagnosis not present

## 2021-07-19 DIAGNOSIS — I10 Essential (primary) hypertension: Secondary | ICD-10-CM

## 2021-08-15 ENCOUNTER — Other Ambulatory Visit: Payer: Self-pay

## 2021-08-15 ENCOUNTER — Encounter (INDEPENDENT_AMBULATORY_CARE_PROVIDER_SITE_OTHER): Payer: Medicare (Managed Care) | Admitting: Ophthalmology

## 2021-08-15 DIAGNOSIS — H43811 Vitreous degeneration, right eye: Secondary | ICD-10-CM

## 2021-08-15 DIAGNOSIS — H35372 Puckering of macula, left eye: Secondary | ICD-10-CM | POA: Diagnosis not present

## 2021-08-15 DIAGNOSIS — E113513 Type 2 diabetes mellitus with proliferative diabetic retinopathy with macular edema, bilateral: Secondary | ICD-10-CM

## 2021-08-15 DIAGNOSIS — H35033 Hypertensive retinopathy, bilateral: Secondary | ICD-10-CM

## 2021-08-15 DIAGNOSIS — I10 Essential (primary) hypertension: Secondary | ICD-10-CM

## 2021-09-12 ENCOUNTER — Encounter (INDEPENDENT_AMBULATORY_CARE_PROVIDER_SITE_OTHER): Payer: Medicare (Managed Care) | Admitting: Ophthalmology

## 2021-09-12 ENCOUNTER — Other Ambulatory Visit: Payer: Self-pay

## 2021-09-12 ENCOUNTER — Other Ambulatory Visit: Payer: Self-pay | Admitting: Hospitalist

## 2021-09-12 DIAGNOSIS — I10 Essential (primary) hypertension: Secondary | ICD-10-CM

## 2021-09-12 DIAGNOSIS — Z1231 Encounter for screening mammogram for malignant neoplasm of breast: Secondary | ICD-10-CM

## 2021-09-12 DIAGNOSIS — H35033 Hypertensive retinopathy, bilateral: Secondary | ICD-10-CM | POA: Diagnosis not present

## 2021-09-12 DIAGNOSIS — E113513 Type 2 diabetes mellitus with proliferative diabetic retinopathy with macular edema, bilateral: Secondary | ICD-10-CM

## 2021-09-12 DIAGNOSIS — H43811 Vitreous degeneration, right eye: Secondary | ICD-10-CM

## 2021-10-04 ENCOUNTER — Ambulatory Visit: Payer: Medicare (Managed Care)

## 2021-10-06 ENCOUNTER — Ambulatory Visit
Admission: RE | Admit: 2021-10-06 | Discharge: 2021-10-06 | Disposition: A | Discharge status: Home / Self Care | Payer: Medicare (Managed Care) | Source: Ambulatory Visit | Attending: Hospitalist | Admitting: Hospitalist

## 2021-10-06 DIAGNOSIS — Z1231 Encounter for screening mammogram for malignant neoplasm of breast: Secondary | ICD-10-CM

## 2021-10-09 ENCOUNTER — Other Ambulatory Visit: Payer: Self-pay | Admitting: Hospitalist

## 2021-10-09 DIAGNOSIS — R928 Other abnormal and inconclusive findings on diagnostic imaging of breast: Secondary | ICD-10-CM

## 2021-10-10 ENCOUNTER — Encounter (INDEPENDENT_AMBULATORY_CARE_PROVIDER_SITE_OTHER): Payer: Medicare (Managed Care) | Admitting: Ophthalmology

## 2021-10-10 ENCOUNTER — Other Ambulatory Visit: Payer: Self-pay

## 2021-10-10 DIAGNOSIS — H43811 Vitreous degeneration, right eye: Secondary | ICD-10-CM

## 2021-10-10 DIAGNOSIS — I10 Essential (primary) hypertension: Secondary | ICD-10-CM | POA: Diagnosis not present

## 2021-10-10 DIAGNOSIS — E113513 Type 2 diabetes mellitus with proliferative diabetic retinopathy with macular edema, bilateral: Secondary | ICD-10-CM

## 2021-10-10 DIAGNOSIS — H35033 Hypertensive retinopathy, bilateral: Secondary | ICD-10-CM | POA: Diagnosis not present

## 2021-10-30 ENCOUNTER — Ambulatory Visit
Admission: RE | Admit: 2021-10-30 | Discharge: 2021-10-30 | Disposition: A | Discharge status: Home / Self Care | Payer: Medicare (Managed Care) | Source: Ambulatory Visit | Attending: Hospitalist | Admitting: Hospitalist

## 2021-10-30 ENCOUNTER — Ambulatory Visit: Payer: Medicare (Managed Care)

## 2021-10-30 DIAGNOSIS — R928 Other abnormal and inconclusive findings on diagnostic imaging of breast: Secondary | ICD-10-CM

## 2021-11-07 ENCOUNTER — Encounter (INDEPENDENT_AMBULATORY_CARE_PROVIDER_SITE_OTHER): Payer: Medicare (Managed Care) | Admitting: Ophthalmology

## 2021-11-07 DIAGNOSIS — H43811 Vitreous degeneration, right eye: Secondary | ICD-10-CM

## 2021-11-07 DIAGNOSIS — E113513 Type 2 diabetes mellitus with proliferative diabetic retinopathy with macular edema, bilateral: Secondary | ICD-10-CM

## 2021-11-07 DIAGNOSIS — H35033 Hypertensive retinopathy, bilateral: Secondary | ICD-10-CM

## 2021-11-07 DIAGNOSIS — I10 Essential (primary) hypertension: Secondary | ICD-10-CM

## 2021-12-05 ENCOUNTER — Encounter (INDEPENDENT_AMBULATORY_CARE_PROVIDER_SITE_OTHER): Payer: Medicare (Managed Care) | Admitting: Ophthalmology

## 2021-12-05 DIAGNOSIS — I10 Essential (primary) hypertension: Secondary | ICD-10-CM | POA: Diagnosis not present

## 2021-12-05 DIAGNOSIS — E113513 Type 2 diabetes mellitus with proliferative diabetic retinopathy with macular edema, bilateral: Secondary | ICD-10-CM

## 2021-12-05 DIAGNOSIS — H35033 Hypertensive retinopathy, bilateral: Secondary | ICD-10-CM | POA: Diagnosis not present

## 2021-12-05 DIAGNOSIS — H43813 Vitreous degeneration, bilateral: Secondary | ICD-10-CM | POA: Diagnosis not present

## 2022-01-02 ENCOUNTER — Encounter (INDEPENDENT_AMBULATORY_CARE_PROVIDER_SITE_OTHER): Payer: Medicare (Managed Care) | Admitting: Ophthalmology

## 2022-01-02 DIAGNOSIS — H35033 Hypertensive retinopathy, bilateral: Secondary | ICD-10-CM | POA: Diagnosis not present

## 2022-01-02 DIAGNOSIS — I10 Essential (primary) hypertension: Secondary | ICD-10-CM

## 2022-01-02 DIAGNOSIS — H43813 Vitreous degeneration, bilateral: Secondary | ICD-10-CM | POA: Diagnosis not present

## 2022-01-02 DIAGNOSIS — E113513 Type 2 diabetes mellitus with proliferative diabetic retinopathy with macular edema, bilateral: Secondary | ICD-10-CM

## 2022-01-30 ENCOUNTER — Encounter (INDEPENDENT_AMBULATORY_CARE_PROVIDER_SITE_OTHER): Payer: Medicare (Managed Care) | Admitting: Ophthalmology

## 2022-01-30 DIAGNOSIS — E113512 Type 2 diabetes mellitus with proliferative diabetic retinopathy with macular edema, left eye: Secondary | ICD-10-CM | POA: Diagnosis not present

## 2022-01-30 DIAGNOSIS — I10 Essential (primary) hypertension: Secondary | ICD-10-CM

## 2022-01-30 DIAGNOSIS — E113591 Type 2 diabetes mellitus with proliferative diabetic retinopathy without macular edema, right eye: Secondary | ICD-10-CM | POA: Diagnosis not present

## 2022-01-30 DIAGNOSIS — H35033 Hypertensive retinopathy, bilateral: Secondary | ICD-10-CM | POA: Diagnosis not present

## 2022-01-30 DIAGNOSIS — H43813 Vitreous degeneration, bilateral: Secondary | ICD-10-CM

## 2022-02-27 ENCOUNTER — Encounter (INDEPENDENT_AMBULATORY_CARE_PROVIDER_SITE_OTHER): Payer: Medicare (Managed Care) | Admitting: Ophthalmology

## 2022-02-27 DIAGNOSIS — H43811 Vitreous degeneration, right eye: Secondary | ICD-10-CM

## 2022-02-27 DIAGNOSIS — E113513 Type 2 diabetes mellitus with proliferative diabetic retinopathy with macular edema, bilateral: Secondary | ICD-10-CM | POA: Diagnosis not present

## 2022-02-27 DIAGNOSIS — H35033 Hypertensive retinopathy, bilateral: Secondary | ICD-10-CM

## 2022-02-27 DIAGNOSIS — I10 Essential (primary) hypertension: Secondary | ICD-10-CM | POA: Diagnosis not present

## 2022-03-27 ENCOUNTER — Encounter (INDEPENDENT_AMBULATORY_CARE_PROVIDER_SITE_OTHER): Payer: Self-pay

## 2022-03-27 ENCOUNTER — Encounter (INDEPENDENT_AMBULATORY_CARE_PROVIDER_SITE_OTHER): Payer: Medicare (Managed Care) | Admitting: Ophthalmology

## 2022-04-03 ENCOUNTER — Encounter (INDEPENDENT_AMBULATORY_CARE_PROVIDER_SITE_OTHER): Payer: Medicare (Managed Care) | Admitting: Ophthalmology

## 2022-04-03 DIAGNOSIS — H35033 Hypertensive retinopathy, bilateral: Secondary | ICD-10-CM

## 2022-04-03 DIAGNOSIS — H43811 Vitreous degeneration, right eye: Secondary | ICD-10-CM

## 2022-04-03 DIAGNOSIS — I10 Essential (primary) hypertension: Secondary | ICD-10-CM

## 2022-04-03 DIAGNOSIS — E113513 Type 2 diabetes mellitus with proliferative diabetic retinopathy with macular edema, bilateral: Secondary | ICD-10-CM

## 2022-05-01 ENCOUNTER — Encounter (INDEPENDENT_AMBULATORY_CARE_PROVIDER_SITE_OTHER): Payer: Medicare (Managed Care) | Admitting: Ophthalmology

## 2022-05-01 DIAGNOSIS — I10 Essential (primary) hypertension: Secondary | ICD-10-CM | POA: Diagnosis not present

## 2022-05-01 DIAGNOSIS — H35033 Hypertensive retinopathy, bilateral: Secondary | ICD-10-CM | POA: Diagnosis not present

## 2022-05-01 DIAGNOSIS — H43811 Vitreous degeneration, right eye: Secondary | ICD-10-CM

## 2022-05-01 DIAGNOSIS — H35372 Puckering of macula, left eye: Secondary | ICD-10-CM | POA: Diagnosis not present

## 2022-05-01 DIAGNOSIS — E113513 Type 2 diabetes mellitus with proliferative diabetic retinopathy with macular edema, bilateral: Secondary | ICD-10-CM | POA: Diagnosis not present

## 2022-05-29 ENCOUNTER — Encounter (INDEPENDENT_AMBULATORY_CARE_PROVIDER_SITE_OTHER): Payer: Medicare (Managed Care) | Admitting: Ophthalmology

## 2022-05-29 DIAGNOSIS — E113513 Type 2 diabetes mellitus with proliferative diabetic retinopathy with macular edema, bilateral: Secondary | ICD-10-CM | POA: Diagnosis not present

## 2022-05-29 DIAGNOSIS — H43811 Vitreous degeneration, right eye: Secondary | ICD-10-CM | POA: Diagnosis not present

## 2022-05-29 DIAGNOSIS — H35033 Hypertensive retinopathy, bilateral: Secondary | ICD-10-CM

## 2022-05-29 DIAGNOSIS — I10 Essential (primary) hypertension: Secondary | ICD-10-CM

## 2022-06-26 ENCOUNTER — Encounter (INDEPENDENT_AMBULATORY_CARE_PROVIDER_SITE_OTHER): Payer: Medicare (Managed Care) | Admitting: Ophthalmology

## 2022-06-26 DIAGNOSIS — I10 Essential (primary) hypertension: Secondary | ICD-10-CM

## 2022-06-26 DIAGNOSIS — E113513 Type 2 diabetes mellitus with proliferative diabetic retinopathy with macular edema, bilateral: Secondary | ICD-10-CM | POA: Diagnosis not present

## 2022-06-26 DIAGNOSIS — H35372 Puckering of macula, left eye: Secondary | ICD-10-CM

## 2022-06-26 DIAGNOSIS — H43811 Vitreous degeneration, right eye: Secondary | ICD-10-CM

## 2022-06-26 DIAGNOSIS — H35033 Hypertensive retinopathy, bilateral: Secondary | ICD-10-CM | POA: Diagnosis not present

## 2022-07-24 ENCOUNTER — Encounter (INDEPENDENT_AMBULATORY_CARE_PROVIDER_SITE_OTHER): Payer: Medicare (Managed Care) | Admitting: Ophthalmology

## 2022-07-24 DIAGNOSIS — I10 Essential (primary) hypertension: Secondary | ICD-10-CM | POA: Diagnosis not present

## 2022-07-24 DIAGNOSIS — E113513 Type 2 diabetes mellitus with proliferative diabetic retinopathy with macular edema, bilateral: Secondary | ICD-10-CM

## 2022-07-24 DIAGNOSIS — H35033 Hypertensive retinopathy, bilateral: Secondary | ICD-10-CM

## 2022-07-24 DIAGNOSIS — H43811 Vitreous degeneration, right eye: Secondary | ICD-10-CM

## 2022-08-21 ENCOUNTER — Encounter (INDEPENDENT_AMBULATORY_CARE_PROVIDER_SITE_OTHER): Payer: Medicare (Managed Care) | Admitting: Ophthalmology

## 2022-08-21 DIAGNOSIS — H35033 Hypertensive retinopathy, bilateral: Secondary | ICD-10-CM | POA: Diagnosis not present

## 2022-08-21 DIAGNOSIS — I10 Essential (primary) hypertension: Secondary | ICD-10-CM

## 2022-08-21 DIAGNOSIS — H43811 Vitreous degeneration, right eye: Secondary | ICD-10-CM

## 2022-08-21 DIAGNOSIS — H35372 Puckering of macula, left eye: Secondary | ICD-10-CM

## 2022-08-21 DIAGNOSIS — E113513 Type 2 diabetes mellitus with proliferative diabetic retinopathy with macular edema, bilateral: Secondary | ICD-10-CM | POA: Diagnosis not present

## 2022-08-24 ENCOUNTER — Encounter: Payer: Self-pay | Admitting: Family Medicine

## 2022-09-18 ENCOUNTER — Encounter (INDEPENDENT_AMBULATORY_CARE_PROVIDER_SITE_OTHER): Payer: Medicare (Managed Care) | Admitting: Ophthalmology

## 2022-09-18 DIAGNOSIS — E113513 Type 2 diabetes mellitus with proliferative diabetic retinopathy with macular edema, bilateral: Secondary | ICD-10-CM | POA: Diagnosis not present

## 2022-09-18 DIAGNOSIS — I10 Essential (primary) hypertension: Secondary | ICD-10-CM

## 2022-09-18 DIAGNOSIS — H43811 Vitreous degeneration, right eye: Secondary | ICD-10-CM | POA: Diagnosis not present

## 2022-09-18 DIAGNOSIS — H35033 Hypertensive retinopathy, bilateral: Secondary | ICD-10-CM | POA: Diagnosis not present

## 2022-10-11 ENCOUNTER — Other Ambulatory Visit: Payer: Self-pay

## 2022-10-11 DIAGNOSIS — Z1231 Encounter for screening mammogram for malignant neoplasm of breast: Secondary | ICD-10-CM

## 2022-10-13 LAB — GLUCOSE, POCT (MANUAL RESULT ENTRY): Glucose Fasting, POC: 90 mg/dL (ref 70–99)

## 2022-10-13 NOTE — Progress Notes (Signed)
Pt has PCP. Please follow up on housing./financial needs for cousin that she lives with.

## 2022-10-16 ENCOUNTER — Encounter (INDEPENDENT_AMBULATORY_CARE_PROVIDER_SITE_OTHER): Payer: Medicare (Managed Care) | Admitting: Ophthalmology

## 2022-10-19 ENCOUNTER — Encounter: Payer: Self-pay | Admitting: *Deleted

## 2022-10-19 NOTE — Progress Notes (Signed)
Pt attended 10/13/22 screening event where b/p was 151/76. Pt states she has [facility] as her PCP and she will let them know about her b/p numbers. Pt had also asked at event with f/u for her home, where she lives with her cousin who owns the house. Pt given [place] 2-1-1 phone number to call to see if there are any resources in the [place] area to help her an her cousin with needed repairs. Pr verbalized understanding of possible support via [place] [facility] resource inquiry call. No additional health equity team support indicated at this time.

## 2022-10-23 ENCOUNTER — Encounter (INDEPENDENT_AMBULATORY_CARE_PROVIDER_SITE_OTHER): Payer: Medicare (Managed Care) | Admitting: Ophthalmology

## 2022-10-23 DIAGNOSIS — H35033 Hypertensive retinopathy, bilateral: Secondary | ICD-10-CM | POA: Diagnosis not present

## 2022-10-23 DIAGNOSIS — E113513 Type 2 diabetes mellitus with proliferative diabetic retinopathy with macular edema, bilateral: Secondary | ICD-10-CM | POA: Diagnosis not present

## 2022-10-23 DIAGNOSIS — I10 Essential (primary) hypertension: Secondary | ICD-10-CM

## 2022-10-23 DIAGNOSIS — H43811 Vitreous degeneration, right eye: Secondary | ICD-10-CM

## 2022-11-20 ENCOUNTER — Encounter (INDEPENDENT_AMBULATORY_CARE_PROVIDER_SITE_OTHER): Payer: Medicare (Managed Care) | Admitting: Ophthalmology

## 2022-11-20 DIAGNOSIS — H43811 Vitreous degeneration, right eye: Secondary | ICD-10-CM

## 2022-11-20 DIAGNOSIS — H35033 Hypertensive retinopathy, bilateral: Secondary | ICD-10-CM

## 2022-11-20 DIAGNOSIS — I10 Essential (primary) hypertension: Secondary | ICD-10-CM

## 2022-11-20 DIAGNOSIS — E113513 Type 2 diabetes mellitus with proliferative diabetic retinopathy with macular edema, bilateral: Secondary | ICD-10-CM | POA: Diagnosis not present

## 2022-11-21 ENCOUNTER — Encounter (INDEPENDENT_AMBULATORY_CARE_PROVIDER_SITE_OTHER): Payer: Medicare (Managed Care) | Admitting: Ophthalmology

## 2022-11-29 ENCOUNTER — Ambulatory Visit
Admission: RE | Admit: 2022-11-29 | Discharge: 2022-11-29 | Disposition: A | Discharge status: Home / Self Care | Payer: Medicare (Managed Care) | Source: Ambulatory Visit

## 2022-11-29 DIAGNOSIS — Z1231 Encounter for screening mammogram for malignant neoplasm of breast: Secondary | ICD-10-CM

## 2022-12-18 ENCOUNTER — Encounter (INDEPENDENT_AMBULATORY_CARE_PROVIDER_SITE_OTHER): Payer: Medicare (Managed Care) | Admitting: Ophthalmology

## 2022-12-19 ENCOUNTER — Encounter (INDEPENDENT_AMBULATORY_CARE_PROVIDER_SITE_OTHER): Payer: Medicare (Managed Care) | Admitting: Ophthalmology

## 2022-12-19 DIAGNOSIS — Z7985 Long-term (current) use of injectable non-insulin antidiabetic drugs: Secondary | ICD-10-CM

## 2022-12-19 DIAGNOSIS — I10 Essential (primary) hypertension: Secondary | ICD-10-CM

## 2022-12-19 DIAGNOSIS — E113513 Type 2 diabetes mellitus with proliferative diabetic retinopathy with macular edema, bilateral: Secondary | ICD-10-CM

## 2022-12-19 DIAGNOSIS — H35033 Hypertensive retinopathy, bilateral: Secondary | ICD-10-CM

## 2022-12-19 DIAGNOSIS — H43811 Vitreous degeneration, right eye: Secondary | ICD-10-CM

## 2022-12-19 DIAGNOSIS — H35372 Puckering of macula, left eye: Secondary | ICD-10-CM

## 2023-01-16 ENCOUNTER — Encounter (INDEPENDENT_AMBULATORY_CARE_PROVIDER_SITE_OTHER): Payer: Medicare (Managed Care) | Admitting: Ophthalmology

## 2023-01-16 ENCOUNTER — Encounter (INDEPENDENT_AMBULATORY_CARE_PROVIDER_SITE_OTHER): Payer: Self-pay

## 2023-01-22 ENCOUNTER — Encounter (INDEPENDENT_AMBULATORY_CARE_PROVIDER_SITE_OTHER): Payer: Medicare (Managed Care) | Admitting: Ophthalmology

## 2023-01-22 DIAGNOSIS — E113513 Type 2 diabetes mellitus with proliferative diabetic retinopathy with macular edema, bilateral: Secondary | ICD-10-CM

## 2023-01-22 DIAGNOSIS — H35033 Hypertensive retinopathy, bilateral: Secondary | ICD-10-CM

## 2023-01-22 DIAGNOSIS — I10 Essential (primary) hypertension: Secondary | ICD-10-CM

## 2023-01-22 DIAGNOSIS — H43811 Vitreous degeneration, right eye: Secondary | ICD-10-CM

## 2023-02-18 ENCOUNTER — Encounter (INDEPENDENT_AMBULATORY_CARE_PROVIDER_SITE_OTHER): Payer: Medicare (Managed Care) | Admitting: Ophthalmology

## 2023-02-19 ENCOUNTER — Encounter (INDEPENDENT_AMBULATORY_CARE_PROVIDER_SITE_OTHER): Payer: Medicare (Managed Care) | Admitting: Ophthalmology

## 2023-02-20 ENCOUNTER — Encounter (INDEPENDENT_AMBULATORY_CARE_PROVIDER_SITE_OTHER): Payer: Medicare (Managed Care) | Admitting: Ophthalmology

## 2023-02-20 DIAGNOSIS — I10 Essential (primary) hypertension: Secondary | ICD-10-CM

## 2023-02-20 DIAGNOSIS — H35372 Puckering of macula, left eye: Secondary | ICD-10-CM | POA: Diagnosis not present

## 2023-02-20 DIAGNOSIS — H35033 Hypertensive retinopathy, bilateral: Secondary | ICD-10-CM | POA: Diagnosis not present

## 2023-02-20 DIAGNOSIS — H43811 Vitreous degeneration, right eye: Secondary | ICD-10-CM

## 2023-02-20 DIAGNOSIS — E113513 Type 2 diabetes mellitus with proliferative diabetic retinopathy with macular edema, bilateral: Secondary | ICD-10-CM

## 2023-02-20 DIAGNOSIS — Z7985 Long-term (current) use of injectable non-insulin antidiabetic drugs: Secondary | ICD-10-CM

## 2023-03-20 ENCOUNTER — Encounter (INDEPENDENT_AMBULATORY_CARE_PROVIDER_SITE_OTHER): Payer: Medicare (Managed Care) | Admitting: Ophthalmology

## 2023-03-20 DIAGNOSIS — Z7985 Long-term (current) use of injectable non-insulin antidiabetic drugs: Secondary | ICD-10-CM

## 2023-03-20 DIAGNOSIS — H43813 Vitreous degeneration, bilateral: Secondary | ICD-10-CM

## 2023-03-20 DIAGNOSIS — H35033 Hypertensive retinopathy, bilateral: Secondary | ICD-10-CM

## 2023-03-20 DIAGNOSIS — E113513 Type 2 diabetes mellitus with proliferative diabetic retinopathy with macular edema, bilateral: Secondary | ICD-10-CM

## 2023-03-20 DIAGNOSIS — I10 Essential (primary) hypertension: Secondary | ICD-10-CM | POA: Diagnosis not present

## 2023-04-16 ENCOUNTER — Encounter (INDEPENDENT_AMBULATORY_CARE_PROVIDER_SITE_OTHER): Payer: Medicare (Managed Care) | Admitting: Ophthalmology

## 2023-04-16 DIAGNOSIS — E113513 Type 2 diabetes mellitus with proliferative diabetic retinopathy with macular edema, bilateral: Secondary | ICD-10-CM | POA: Diagnosis not present

## 2023-04-16 DIAGNOSIS — Z7985 Long-term (current) use of injectable non-insulin antidiabetic drugs: Secondary | ICD-10-CM | POA: Diagnosis not present

## 2023-04-16 DIAGNOSIS — H35033 Hypertensive retinopathy, bilateral: Secondary | ICD-10-CM | POA: Diagnosis not present

## 2023-04-16 DIAGNOSIS — I10 Essential (primary) hypertension: Secondary | ICD-10-CM

## 2023-04-16 DIAGNOSIS — H43811 Vitreous degeneration, right eye: Secondary | ICD-10-CM

## 2023-05-14 ENCOUNTER — Encounter (INDEPENDENT_AMBULATORY_CARE_PROVIDER_SITE_OTHER): Payer: Medicare (Managed Care) | Admitting: Ophthalmology

## 2023-05-14 DIAGNOSIS — I10 Essential (primary) hypertension: Secondary | ICD-10-CM | POA: Diagnosis not present

## 2023-05-14 DIAGNOSIS — H35033 Hypertensive retinopathy, bilateral: Secondary | ICD-10-CM

## 2023-05-14 DIAGNOSIS — E113513 Type 2 diabetes mellitus with proliferative diabetic retinopathy with macular edema, bilateral: Secondary | ICD-10-CM

## 2023-05-14 DIAGNOSIS — Z7985 Long-term (current) use of injectable non-insulin antidiabetic drugs: Secondary | ICD-10-CM | POA: Diagnosis not present

## 2023-05-14 DIAGNOSIS — H35372 Puckering of macula, left eye: Secondary | ICD-10-CM

## 2023-05-14 DIAGNOSIS — H43811 Vitreous degeneration, right eye: Secondary | ICD-10-CM

## 2023-05-21 ENCOUNTER — Encounter (INDEPENDENT_AMBULATORY_CARE_PROVIDER_SITE_OTHER): Payer: Medicare (Managed Care) | Admitting: Ophthalmology

## 2023-06-11 ENCOUNTER — Encounter (INDEPENDENT_AMBULATORY_CARE_PROVIDER_SITE_OTHER): Payer: Medicare (Managed Care) | Admitting: Ophthalmology

## 2023-06-18 ENCOUNTER — Encounter (INDEPENDENT_AMBULATORY_CARE_PROVIDER_SITE_OTHER): Payer: Medicare (Managed Care) | Admitting: Ophthalmology

## 2023-06-18 DIAGNOSIS — E113513 Type 2 diabetes mellitus with proliferative diabetic retinopathy with macular edema, bilateral: Secondary | ICD-10-CM

## 2023-06-18 DIAGNOSIS — H35033 Hypertensive retinopathy, bilateral: Secondary | ICD-10-CM

## 2023-06-18 DIAGNOSIS — H43811 Vitreous degeneration, right eye: Secondary | ICD-10-CM

## 2023-06-18 DIAGNOSIS — I10 Essential (primary) hypertension: Secondary | ICD-10-CM

## 2023-06-18 DIAGNOSIS — Z7985 Long-term (current) use of injectable non-insulin antidiabetic drugs: Secondary | ICD-10-CM | POA: Diagnosis not present

## 2023-06-18 DIAGNOSIS — H35372 Puckering of macula, left eye: Secondary | ICD-10-CM

## 2023-06-18 DIAGNOSIS — H4311 Vitreous hemorrhage, right eye: Secondary | ICD-10-CM

## 2023-07-16 ENCOUNTER — Encounter (INDEPENDENT_AMBULATORY_CARE_PROVIDER_SITE_OTHER): Payer: Medicare (Managed Care) | Admitting: Ophthalmology

## 2023-07-16 DIAGNOSIS — H35033 Hypertensive retinopathy, bilateral: Secondary | ICD-10-CM | POA: Diagnosis not present

## 2023-07-16 DIAGNOSIS — E113513 Type 2 diabetes mellitus with proliferative diabetic retinopathy with macular edema, bilateral: Secondary | ICD-10-CM | POA: Diagnosis not present

## 2023-07-16 DIAGNOSIS — H4311 Vitreous hemorrhage, right eye: Secondary | ICD-10-CM

## 2023-07-16 DIAGNOSIS — I10 Essential (primary) hypertension: Secondary | ICD-10-CM

## 2023-07-16 DIAGNOSIS — H43811 Vitreous degeneration, right eye: Secondary | ICD-10-CM

## 2023-07-16 DIAGNOSIS — Z7985 Long-term (current) use of injectable non-insulin antidiabetic drugs: Secondary | ICD-10-CM

## 2023-08-13 ENCOUNTER — Encounter (INDEPENDENT_AMBULATORY_CARE_PROVIDER_SITE_OTHER): Payer: Medicare (Managed Care) | Admitting: Ophthalmology

## 2023-08-21 ENCOUNTER — Encounter (INDEPENDENT_AMBULATORY_CARE_PROVIDER_SITE_OTHER): Payer: Medicare (Managed Care) | Admitting: Ophthalmology

## 2023-08-27 ENCOUNTER — Encounter (INDEPENDENT_AMBULATORY_CARE_PROVIDER_SITE_OTHER): Payer: Medicare (Managed Care) | Admitting: Ophthalmology

## 2023-08-27 DIAGNOSIS — E113513 Type 2 diabetes mellitus with proliferative diabetic retinopathy with macular edema, bilateral: Secondary | ICD-10-CM

## 2023-08-27 DIAGNOSIS — H35033 Hypertensive retinopathy, bilateral: Secondary | ICD-10-CM | POA: Diagnosis not present

## 2023-08-27 DIAGNOSIS — Z7985 Long-term (current) use of injectable non-insulin antidiabetic drugs: Secondary | ICD-10-CM | POA: Diagnosis not present

## 2023-08-27 DIAGNOSIS — I10 Essential (primary) hypertension: Secondary | ICD-10-CM

## 2023-08-27 DIAGNOSIS — H43811 Vitreous degeneration, right eye: Secondary | ICD-10-CM

## 2023-09-24 ENCOUNTER — Encounter (INDEPENDENT_AMBULATORY_CARE_PROVIDER_SITE_OTHER): Payer: Medicare (Managed Care) | Admitting: Ophthalmology

## 2023-10-07 ENCOUNTER — Encounter (INDEPENDENT_AMBULATORY_CARE_PROVIDER_SITE_OTHER): Payer: Medicare (Managed Care) | Admitting: Ophthalmology

## 2023-10-07 DIAGNOSIS — Z7985 Long-term (current) use of injectable non-insulin antidiabetic drugs: Secondary | ICD-10-CM

## 2023-10-07 DIAGNOSIS — H4311 Vitreous hemorrhage, right eye: Secondary | ICD-10-CM

## 2023-10-07 DIAGNOSIS — E113513 Type 2 diabetes mellitus with proliferative diabetic retinopathy with macular edema, bilateral: Secondary | ICD-10-CM | POA: Diagnosis not present

## 2023-10-07 DIAGNOSIS — H43811 Vitreous degeneration, right eye: Secondary | ICD-10-CM

## 2023-10-07 DIAGNOSIS — I10 Essential (primary) hypertension: Secondary | ICD-10-CM | POA: Diagnosis not present

## 2023-10-07 DIAGNOSIS — H35033 Hypertensive retinopathy, bilateral: Secondary | ICD-10-CM

## 2023-10-17 ENCOUNTER — Other Ambulatory Visit: Payer: Self-pay | Admitting: Internal Medicine

## 2023-10-17 DIAGNOSIS — Z1231 Encounter for screening mammogram for malignant neoplasm of breast: Secondary | ICD-10-CM

## 2023-11-04 ENCOUNTER — Encounter (INDEPENDENT_AMBULATORY_CARE_PROVIDER_SITE_OTHER): Payer: Medicare (Managed Care) | Admitting: Ophthalmology

## 2023-11-13 ENCOUNTER — Encounter (INDEPENDENT_AMBULATORY_CARE_PROVIDER_SITE_OTHER): Payer: Medicare (Managed Care) | Admitting: Ophthalmology

## 2023-11-13 DIAGNOSIS — I10 Essential (primary) hypertension: Secondary | ICD-10-CM

## 2023-11-13 DIAGNOSIS — H35033 Hypertensive retinopathy, bilateral: Secondary | ICD-10-CM

## 2023-11-13 DIAGNOSIS — Z7985 Long-term (current) use of injectable non-insulin antidiabetic drugs: Secondary | ICD-10-CM | POA: Diagnosis not present

## 2023-11-13 DIAGNOSIS — E113513 Type 2 diabetes mellitus with proliferative diabetic retinopathy with macular edema, bilateral: Secondary | ICD-10-CM

## 2023-11-13 DIAGNOSIS — H43811 Vitreous degeneration, right eye: Secondary | ICD-10-CM

## 2023-12-02 ENCOUNTER — Ambulatory Visit
Admission: RE | Admit: 2023-12-02 | Discharge: 2023-12-02 | Disposition: A | Discharge status: Home / Self Care | Payer: Medicare (Managed Care) | Source: Ambulatory Visit | Attending: Internal Medicine | Admitting: Internal Medicine

## 2023-12-02 DIAGNOSIS — Z1231 Encounter for screening mammogram for malignant neoplasm of breast: Secondary | ICD-10-CM

## 2023-12-11 ENCOUNTER — Encounter (INDEPENDENT_AMBULATORY_CARE_PROVIDER_SITE_OTHER): Payer: Medicare (Managed Care) | Admitting: Ophthalmology

## 2023-12-11 DIAGNOSIS — I10 Essential (primary) hypertension: Secondary | ICD-10-CM | POA: Diagnosis not present

## 2023-12-11 DIAGNOSIS — Z7985 Long-term (current) use of injectable non-insulin antidiabetic drugs: Secondary | ICD-10-CM | POA: Diagnosis not present

## 2023-12-11 DIAGNOSIS — H35033 Hypertensive retinopathy, bilateral: Secondary | ICD-10-CM

## 2023-12-11 DIAGNOSIS — E113513 Type 2 diabetes mellitus with proliferative diabetic retinopathy with macular edema, bilateral: Secondary | ICD-10-CM | POA: Diagnosis not present

## 2023-12-11 DIAGNOSIS — H43811 Vitreous degeneration, right eye: Secondary | ICD-10-CM

## 2024-01-13 ENCOUNTER — Encounter (INDEPENDENT_AMBULATORY_CARE_PROVIDER_SITE_OTHER): Payer: Medicare (Managed Care) | Admitting: Ophthalmology

## 2024-01-13 DIAGNOSIS — H43811 Vitreous degeneration, right eye: Secondary | ICD-10-CM

## 2024-01-13 DIAGNOSIS — H35033 Hypertensive retinopathy, bilateral: Secondary | ICD-10-CM

## 2024-01-13 DIAGNOSIS — I10 Essential (primary) hypertension: Secondary | ICD-10-CM | POA: Diagnosis not present

## 2024-01-13 DIAGNOSIS — E113513 Type 2 diabetes mellitus with proliferative diabetic retinopathy with macular edema, bilateral: Secondary | ICD-10-CM | POA: Diagnosis not present

## 2024-01-13 DIAGNOSIS — Z7985 Long-term (current) use of injectable non-insulin antidiabetic drugs: Secondary | ICD-10-CM

## 2024-02-10 ENCOUNTER — Encounter (INDEPENDENT_AMBULATORY_CARE_PROVIDER_SITE_OTHER): Payer: Medicare (Managed Care) | Admitting: Ophthalmology

## 2024-02-12 ENCOUNTER — Encounter (INDEPENDENT_AMBULATORY_CARE_PROVIDER_SITE_OTHER): Payer: Medicare (Managed Care) | Admitting: Ophthalmology

## 2024-02-12 DIAGNOSIS — Z7985 Long-term (current) use of injectable non-insulin antidiabetic drugs: Secondary | ICD-10-CM | POA: Diagnosis not present

## 2024-02-12 DIAGNOSIS — E113513 Type 2 diabetes mellitus with proliferative diabetic retinopathy with macular edema, bilateral: Secondary | ICD-10-CM

## 2024-02-12 DIAGNOSIS — H43811 Vitreous degeneration, right eye: Secondary | ICD-10-CM

## 2024-02-12 DIAGNOSIS — I10 Essential (primary) hypertension: Secondary | ICD-10-CM | POA: Diagnosis not present

## 2024-02-12 DIAGNOSIS — H35033 Hypertensive retinopathy, bilateral: Secondary | ICD-10-CM | POA: Diagnosis not present

## 2024-03-11 ENCOUNTER — Encounter (INDEPENDENT_AMBULATORY_CARE_PROVIDER_SITE_OTHER): Payer: Medicare (Managed Care) | Admitting: Ophthalmology

## 2024-03-11 DIAGNOSIS — I10 Essential (primary) hypertension: Secondary | ICD-10-CM

## 2024-03-11 DIAGNOSIS — H35033 Hypertensive retinopathy, bilateral: Secondary | ICD-10-CM

## 2024-03-11 DIAGNOSIS — E113513 Type 2 diabetes mellitus with proliferative diabetic retinopathy with macular edema, bilateral: Secondary | ICD-10-CM

## 2024-03-11 DIAGNOSIS — H43811 Vitreous degeneration, right eye: Secondary | ICD-10-CM

## 2024-03-11 DIAGNOSIS — Z7985 Long-term (current) use of injectable non-insulin antidiabetic drugs: Secondary | ICD-10-CM | POA: Diagnosis not present

## 2024-03-24 IMAGING — MG MM DIGITAL DIAGNOSTIC UNILAT*R* W/ TOMO W/ CAD
4 series · 4 of 12 positions shown · non-contrast
Comparison: Previous exam(s).

CLINICAL DATA: 70-year-old female recalled from screening mammogram
dated 10/06/2021 for possible right breast distortion.

EXAM:
DIGITAL DIAGNOSTIC UNILATERAL RIGHT MAMMOGRAM WITH TOMOSYNTHESIS AND
CAD
TECHNIQUE: Right digital diagnostic mammography and breast tomosynthesis was
performed. The images were evaluated with computer-aided detection.

[R ML synth-2D]
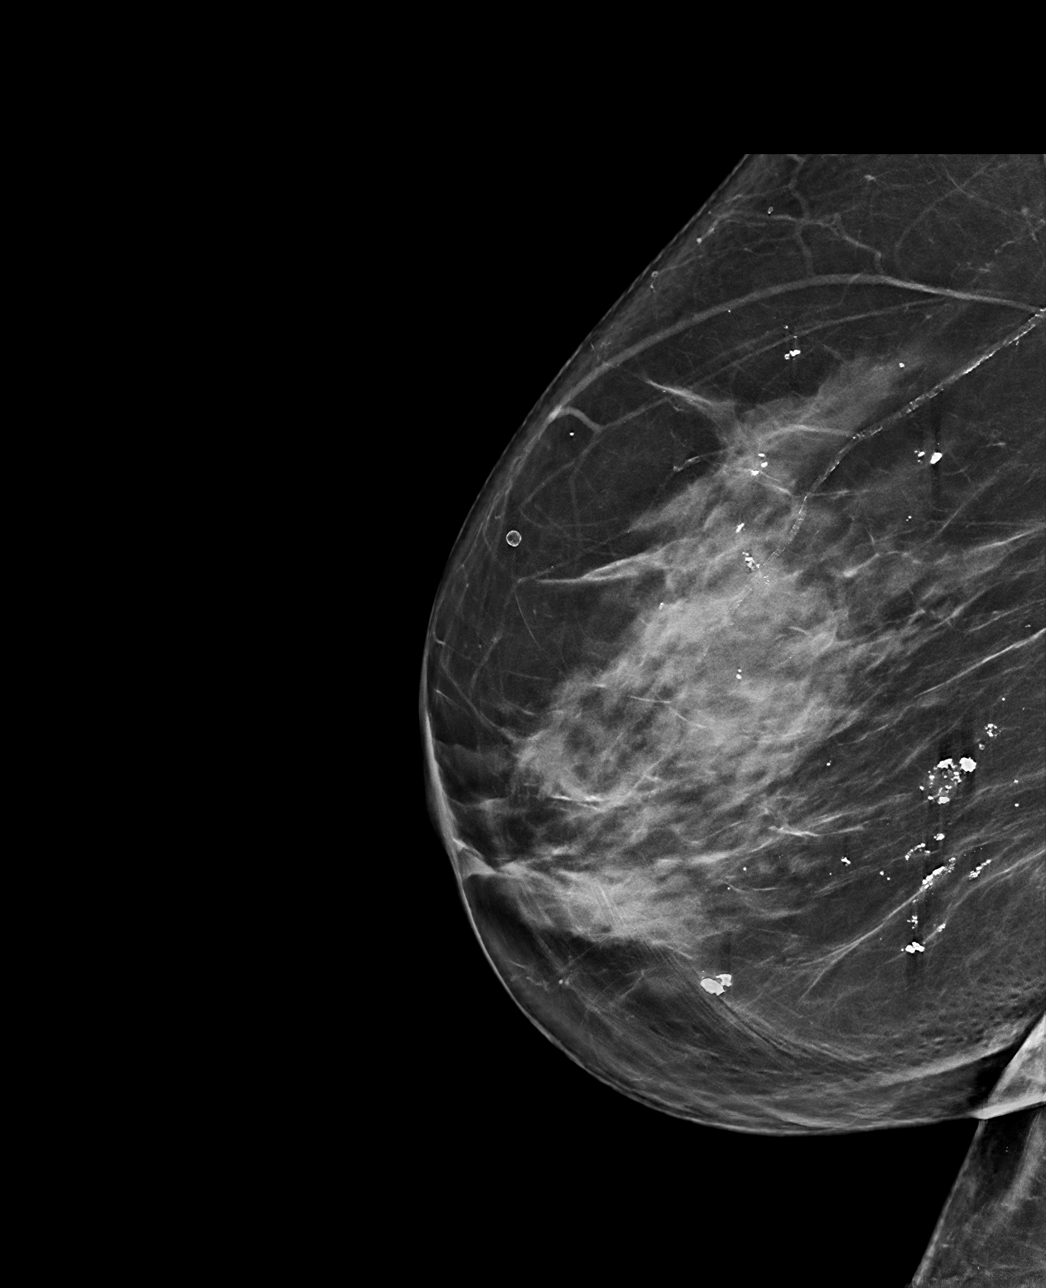

[R CC synth-2D]
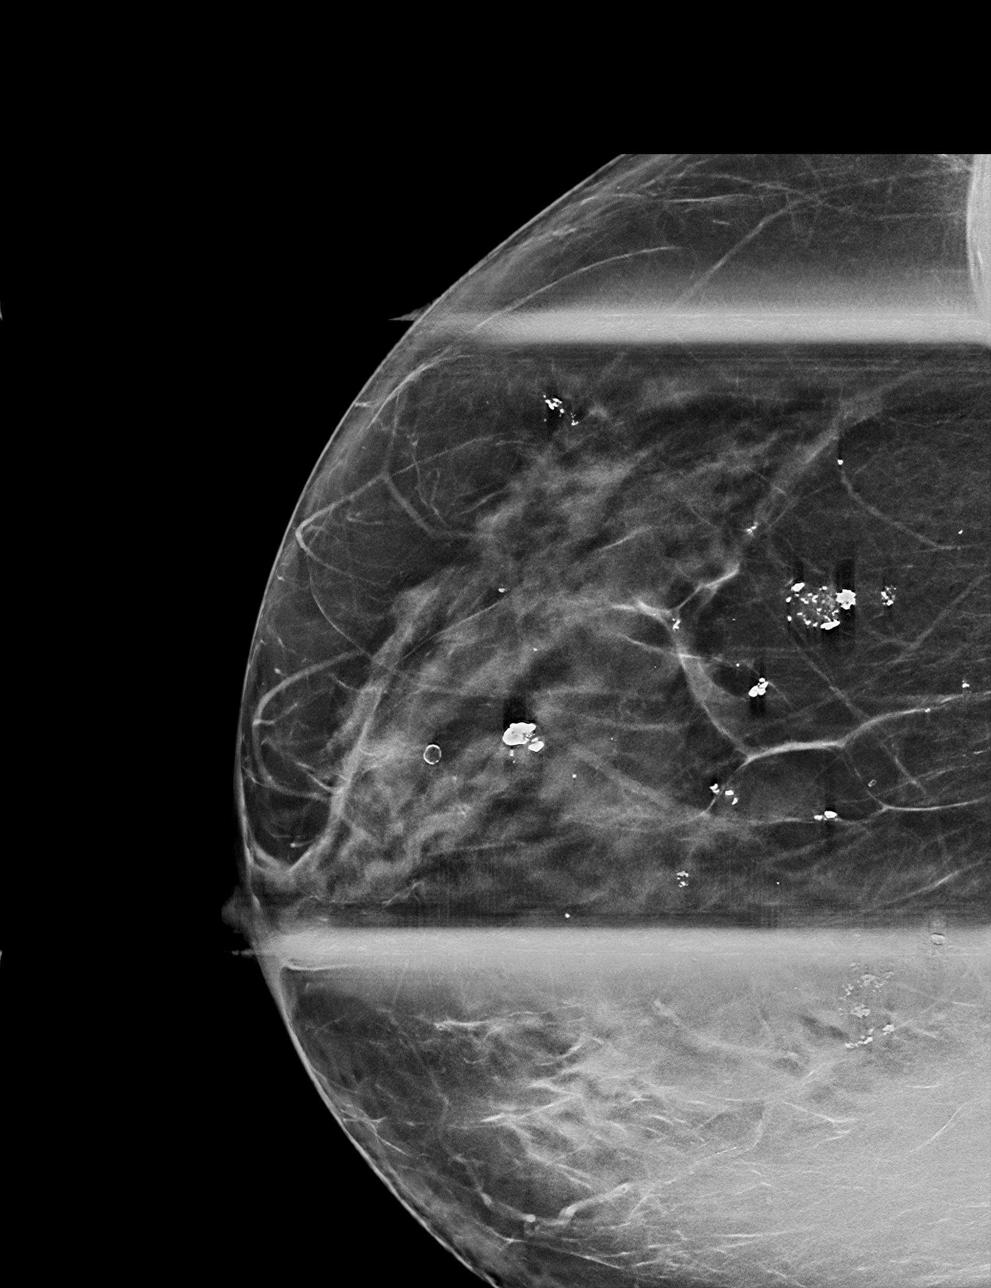

[R ML tomo · tomo slice 41/81.0]
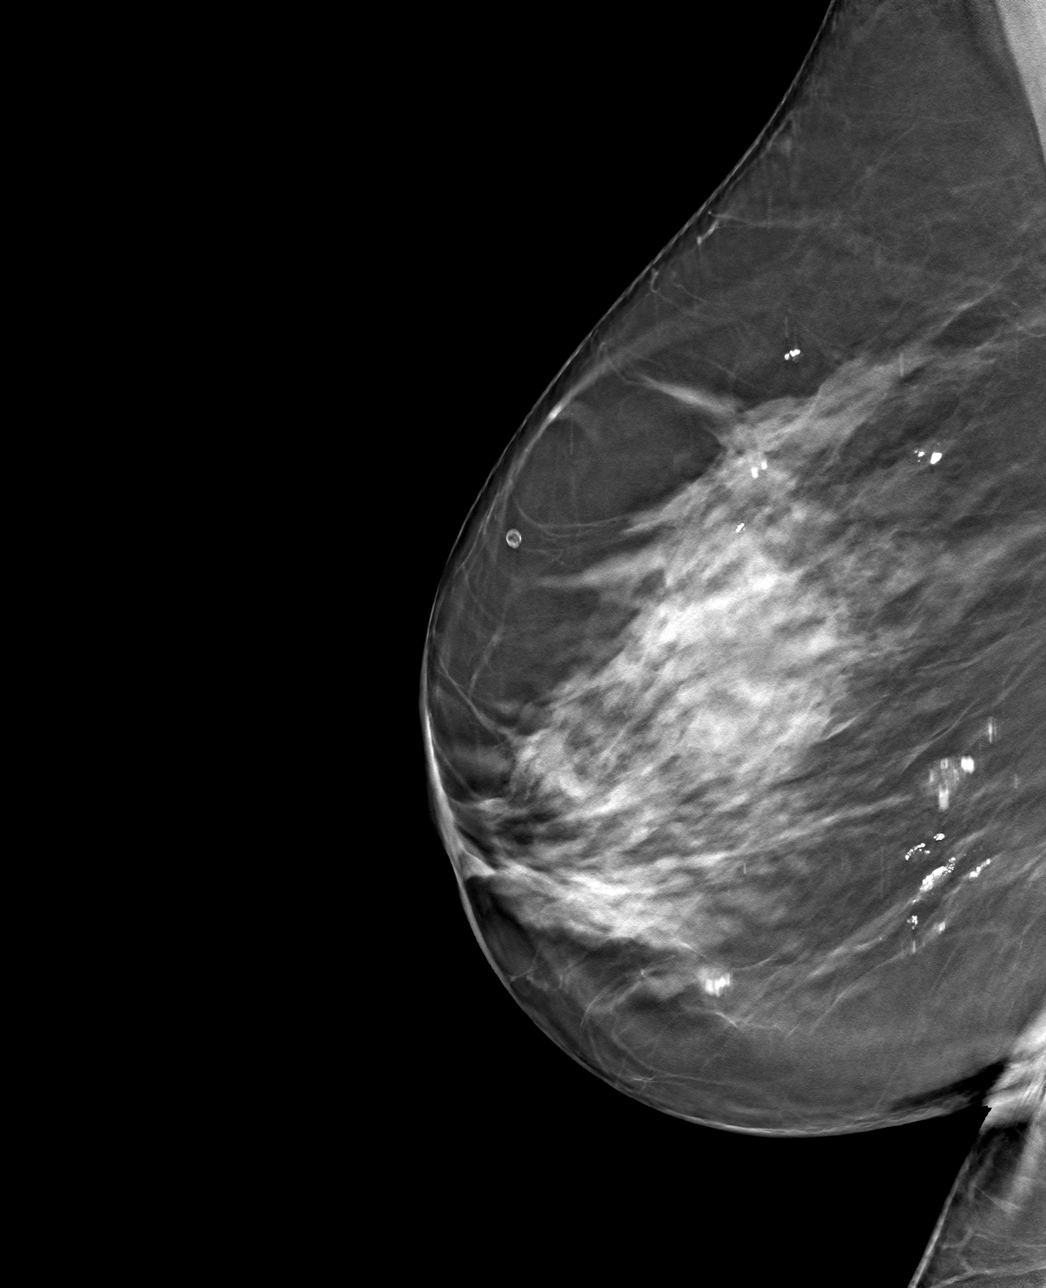

[R CC tomo · tomo slice 33/65.0]
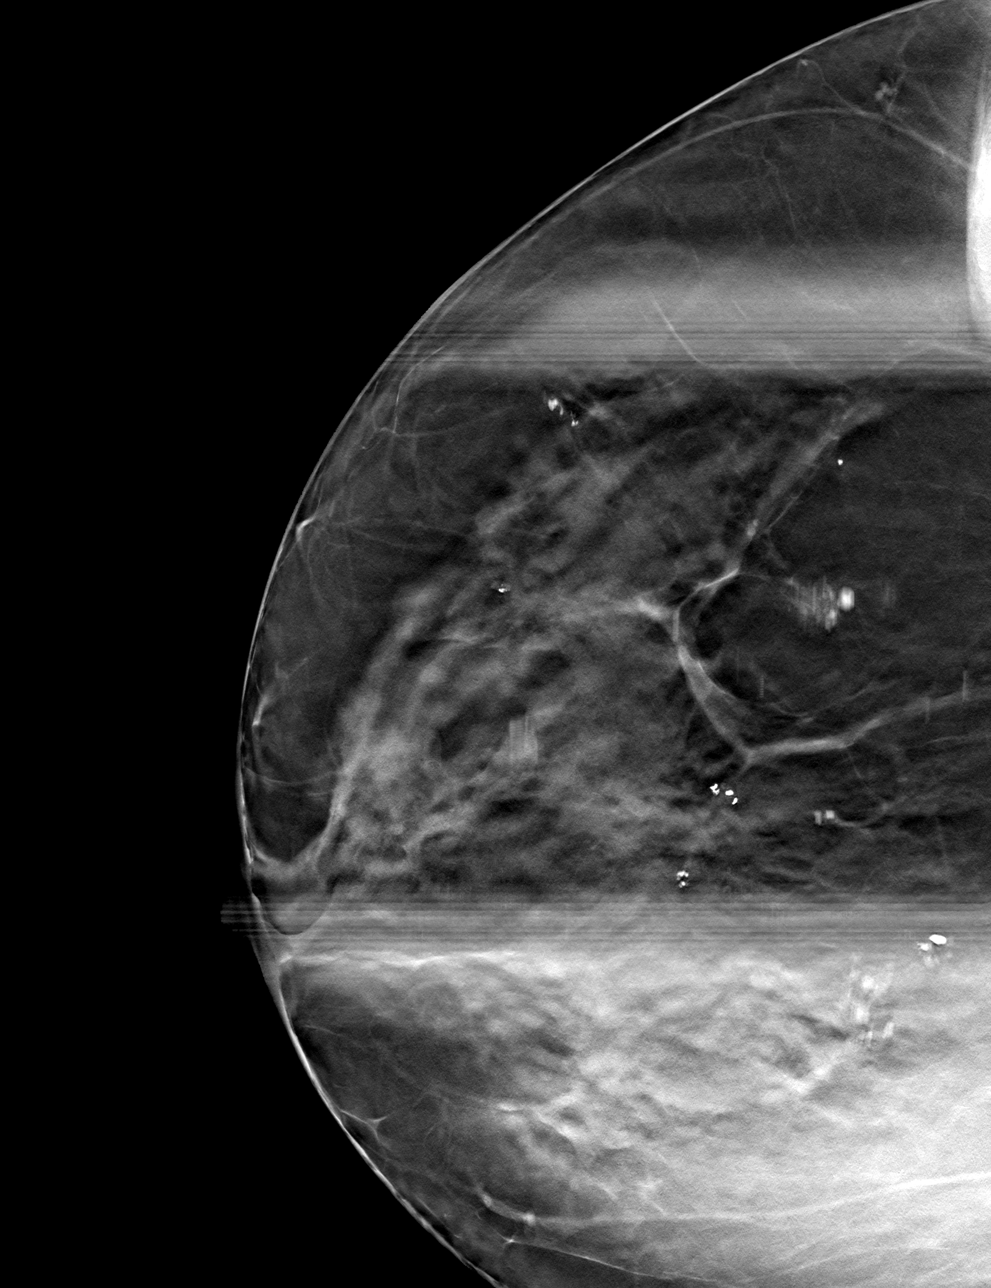

[4 of 12 positions shown; findings below may reference images not displayed]

ACR Breast Density Category c: The breast tissue is heterogeneously
dense, which may obscure small masses.
FINDINGS: Previously described, possible distortion in the lateral right
breast on the CC projection resolves into well dispersed
fibroglandular tissue on additional views. No suspicious findings
identified.
IMPRESSION: No mammographic evidence of malignancy.

RECOMMENDATION:
Screening mammogram in one year.(Code:G8-K-JVA)

I have discussed the findings and recommendations with the patient.
If applicable, a reminder letter will be sent to the patient
regarding the next appointment.

BI-RADS CATEGORY  1: Negative.

## 2024-04-13 ENCOUNTER — Encounter (INDEPENDENT_AMBULATORY_CARE_PROVIDER_SITE_OTHER): Payer: Medicare (Managed Care) | Admitting: Ophthalmology

## 2024-04-13 DIAGNOSIS — Z7985 Long-term (current) use of injectable non-insulin antidiabetic drugs: Secondary | ICD-10-CM

## 2024-04-13 DIAGNOSIS — H43811 Vitreous degeneration, right eye: Secondary | ICD-10-CM

## 2024-04-13 DIAGNOSIS — H35033 Hypertensive retinopathy, bilateral: Secondary | ICD-10-CM | POA: Diagnosis not present

## 2024-04-13 DIAGNOSIS — E113513 Type 2 diabetes mellitus with proliferative diabetic retinopathy with macular edema, bilateral: Secondary | ICD-10-CM | POA: Diagnosis not present

## 2024-04-13 DIAGNOSIS — I10 Essential (primary) hypertension: Secondary | ICD-10-CM

## 2024-05-18 ENCOUNTER — Encounter (INDEPENDENT_AMBULATORY_CARE_PROVIDER_SITE_OTHER): Payer: Medicare (Managed Care) | Admitting: Ophthalmology

## 2024-05-18 DIAGNOSIS — H35033 Hypertensive retinopathy, bilateral: Secondary | ICD-10-CM

## 2024-05-18 DIAGNOSIS — E113513 Type 2 diabetes mellitus with proliferative diabetic retinopathy with macular edema, bilateral: Secondary | ICD-10-CM | POA: Diagnosis not present

## 2024-05-18 DIAGNOSIS — Z7985 Long-term (current) use of injectable non-insulin antidiabetic drugs: Secondary | ICD-10-CM | POA: Diagnosis not present

## 2024-05-18 DIAGNOSIS — I10 Essential (primary) hypertension: Secondary | ICD-10-CM | POA: Diagnosis not present

## 2024-05-18 DIAGNOSIS — H43813 Vitreous degeneration, bilateral: Secondary | ICD-10-CM

## 2024-06-22 ENCOUNTER — Encounter (INDEPENDENT_AMBULATORY_CARE_PROVIDER_SITE_OTHER): Payer: Medicare (Managed Care) | Admitting: Ophthalmology

## 2024-06-22 DIAGNOSIS — I10 Essential (primary) hypertension: Secondary | ICD-10-CM

## 2024-06-22 DIAGNOSIS — H35033 Hypertensive retinopathy, bilateral: Secondary | ICD-10-CM | POA: Diagnosis not present

## 2024-06-22 DIAGNOSIS — E113513 Type 2 diabetes mellitus with proliferative diabetic retinopathy with macular edema, bilateral: Secondary | ICD-10-CM | POA: Diagnosis not present

## 2024-06-22 DIAGNOSIS — H43811 Vitreous degeneration, right eye: Secondary | ICD-10-CM

## 2024-06-22 DIAGNOSIS — Z7985 Long-term (current) use of injectable non-insulin antidiabetic drugs: Secondary | ICD-10-CM

## 2024-07-24 ENCOUNTER — Encounter (INDEPENDENT_AMBULATORY_CARE_PROVIDER_SITE_OTHER): Payer: Medicare (Managed Care) | Admitting: Ophthalmology

## 2024-07-24 DIAGNOSIS — E113513 Type 2 diabetes mellitus with proliferative diabetic retinopathy with macular edema, bilateral: Secondary | ICD-10-CM | POA: Diagnosis not present

## 2024-07-24 DIAGNOSIS — H43811 Vitreous degeneration, right eye: Secondary | ICD-10-CM | POA: Diagnosis not present

## 2024-07-24 DIAGNOSIS — H35033 Hypertensive retinopathy, bilateral: Secondary | ICD-10-CM

## 2024-07-24 DIAGNOSIS — I10 Essential (primary) hypertension: Secondary | ICD-10-CM

## 2024-07-24 DIAGNOSIS — Z7985 Long-term (current) use of injectable non-insulin antidiabetic drugs: Secondary | ICD-10-CM

## 2024-08-21 ENCOUNTER — Encounter (INDEPENDENT_AMBULATORY_CARE_PROVIDER_SITE_OTHER): Payer: Medicare (Managed Care) | Admitting: Ophthalmology

## 2024-08-21 DIAGNOSIS — I10 Essential (primary) hypertension: Secondary | ICD-10-CM | POA: Diagnosis not present

## 2024-08-21 DIAGNOSIS — E113513 Type 2 diabetes mellitus with proliferative diabetic retinopathy with macular edema, bilateral: Secondary | ICD-10-CM | POA: Diagnosis not present

## 2024-08-21 DIAGNOSIS — H35033 Hypertensive retinopathy, bilateral: Secondary | ICD-10-CM | POA: Diagnosis not present

## 2024-08-21 DIAGNOSIS — Z7985 Long-term (current) use of injectable non-insulin antidiabetic drugs: Secondary | ICD-10-CM

## 2024-08-21 DIAGNOSIS — H43811 Vitreous degeneration, right eye: Secondary | ICD-10-CM

## 2024-10-02 ENCOUNTER — Encounter (INDEPENDENT_AMBULATORY_CARE_PROVIDER_SITE_OTHER): Payer: Medicare (Managed Care) | Admitting: Ophthalmology
